# Patient Record
Sex: Female | Born: 1940 | Race: Black or African American | Smoking: Never smoker
Health system: Southern US, Community
[De-identification: ages and names within clinical notes are randomized; demographics above are authoritative.]

## PROBLEM LIST (undated history)

## (undated) DIAGNOSIS — I10 Essential (primary) hypertension: Secondary | ICD-10-CM

## (undated) DIAGNOSIS — Z87442 Personal history of urinary calculi: Secondary | ICD-10-CM

## (undated) DIAGNOSIS — E119 Type 2 diabetes mellitus without complications: Secondary | ICD-10-CM

---

## 2010-05-07 ENCOUNTER — Other Ambulatory Visit (HOSPITAL_COMMUNITY): Payer: Self-pay | Admitting: Family Medicine

## 2010-05-07 DIAGNOSIS — E038 Other specified hypothyroidism: Secondary | ICD-10-CM

## 2010-05-11 ENCOUNTER — Ambulatory Visit (HOSPITAL_COMMUNITY)
Admission: RE | Admit: 2010-05-11 | Discharge: 2010-05-11 | Disposition: A | Discharge status: Home / Self Care | Payer: Medicare Other | Source: Ambulatory Visit | Attending: Family Medicine | Admitting: Family Medicine

## 2010-05-11 ENCOUNTER — Encounter (HOSPITAL_COMMUNITY): Payer: Self-pay

## 2010-05-11 DIAGNOSIS — R000 Tachycardia, unspecified: Secondary | ICD-10-CM | POA: Insufficient documentation

## 2010-05-11 DIAGNOSIS — E038 Other specified hypothyroidism: Secondary | ICD-10-CM

## 2010-05-11 DIAGNOSIS — E041 Nontoxic single thyroid nodule: Secondary | ICD-10-CM | POA: Insufficient documentation

## 2010-05-11 HISTORY — DX: Essential (primary) hypertension: I10

## 2010-05-11 MED ORDER — SODIUM IODIDE I 131 CAPSULE
10.0000 | Freq: Once | INTRAVENOUS | Status: AC | PRN
  Administered 2010-05-11: 9 via ORAL

## 2010-05-12 MED ORDER — SODIUM PERTECHNETATE TC 99M INJECTION
10.0000 | Freq: Once | INTRAVENOUS | Status: AC | PRN
  Administered 2010-05-12: 9.8 via INTRAVENOUS

## 2010-05-21 ENCOUNTER — Other Ambulatory Visit (HOSPITAL_COMMUNITY): Payer: Self-pay | Admitting: Family Medicine

## 2010-05-21 DIAGNOSIS — E049 Nontoxic goiter, unspecified: Secondary | ICD-10-CM

## 2010-05-25 ENCOUNTER — Ambulatory Visit (HOSPITAL_COMMUNITY)
Admission: RE | Admit: 2010-05-25 | Discharge: 2010-05-25 | Disposition: A | Discharge status: Home / Self Care | Payer: Medicare Other | Source: Ambulatory Visit | Attending: Family Medicine | Admitting: Family Medicine

## 2010-05-25 DIAGNOSIS — E042 Nontoxic multinodular goiter: Secondary | ICD-10-CM | POA: Insufficient documentation

## 2010-05-25 DIAGNOSIS — E049 Nontoxic goiter, unspecified: Secondary | ICD-10-CM

## 2010-06-17 ENCOUNTER — Other Ambulatory Visit (HOSPITAL_COMMUNITY): Payer: Self-pay | Admitting: "Endocrinology

## 2010-06-17 DIAGNOSIS — E0590 Thyrotoxicosis, unspecified without thyrotoxic crisis or storm: Secondary | ICD-10-CM

## 2010-06-22 ENCOUNTER — Ambulatory Visit (HOSPITAL_COMMUNITY): Admission: RE | Admit: 2010-06-22 | Payer: Medicare Other | Source: Ambulatory Visit

## 2010-06-23 ENCOUNTER — Other Ambulatory Visit (HOSPITAL_COMMUNITY): Payer: Self-pay | Admitting: "Endocrinology

## 2010-06-23 DIAGNOSIS — E0590 Thyrotoxicosis, unspecified without thyrotoxic crisis or storm: Secondary | ICD-10-CM

## 2010-06-25 ENCOUNTER — Encounter (HOSPITAL_COMMUNITY): Payer: Self-pay

## 2010-06-25 ENCOUNTER — Encounter (HOSPITAL_COMMUNITY)
Admission: RE | Admit: 2010-06-25 | Discharge: 2010-06-25 | Disposition: A | Discharge status: Home / Self Care | Payer: Medicare Other | Source: Ambulatory Visit | Attending: "Endocrinology | Admitting: "Endocrinology

## 2010-06-25 DIAGNOSIS — E0590 Thyrotoxicosis, unspecified without thyrotoxic crisis or storm: Secondary | ICD-10-CM

## 2010-06-25 DIAGNOSIS — E0500 Thyrotoxicosis with diffuse goiter without thyrotoxic crisis or storm: Secondary | ICD-10-CM | POA: Insufficient documentation

## 2010-06-25 MED ORDER — SODIUM IODIDE I 131 CAPSULE
25.0000 | Freq: Once | INTRAVENOUS | Status: AC | PRN
  Administered 2010-06-25: 25 via ORAL

## 2012-06-09 IMAGING — US US SOFT TISSUE HEAD/NECK
1 series · 13 of 25 positions shown · non-contrast
Comparison: None
Correlation: Radionuclide thyroid uptake and scan 05/12/2010

CLINICAL DATA: Right side thyroid enlargement

THYROID ULTRASOUND
TECHNIQUE: Ultrasound examination of the thyroid gland and adjacent
soft tissues was performed.

[Series 1: us soft tissue head/neck · 0.08mm/px · 13 of 33 slices shown]
[im 1/33]
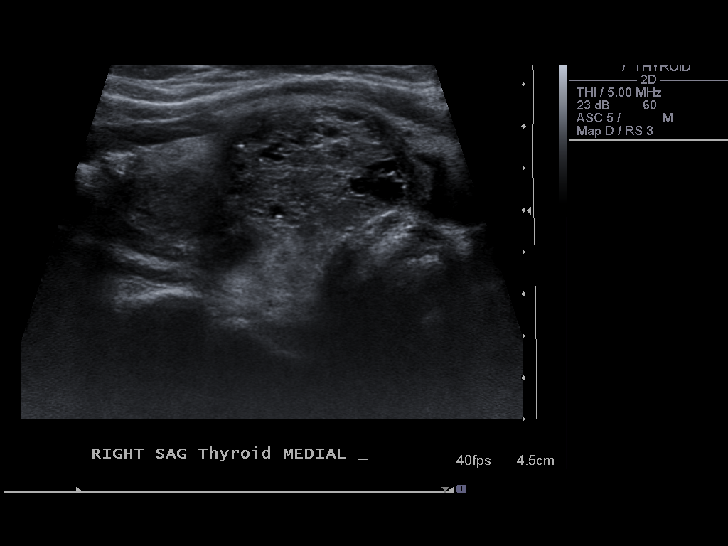
[im 3/33]
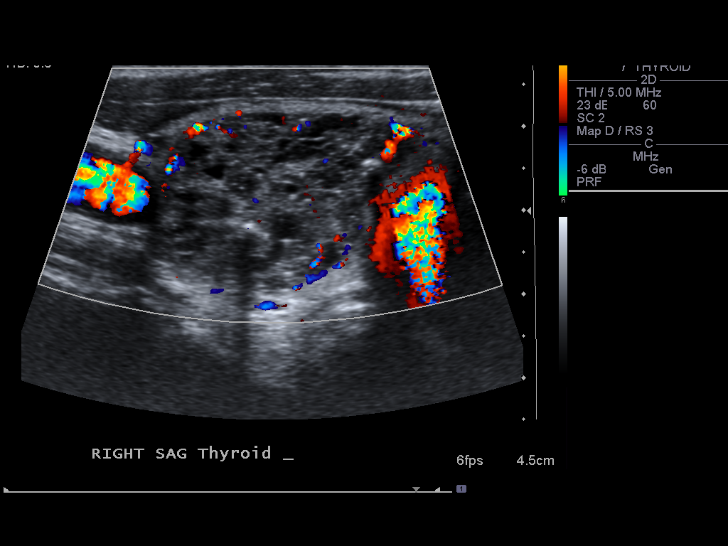
[im 6/33]
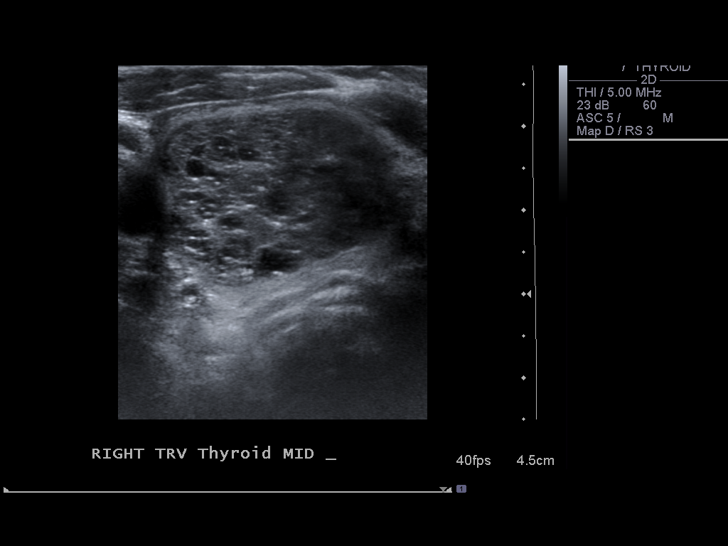
[im 9/33]
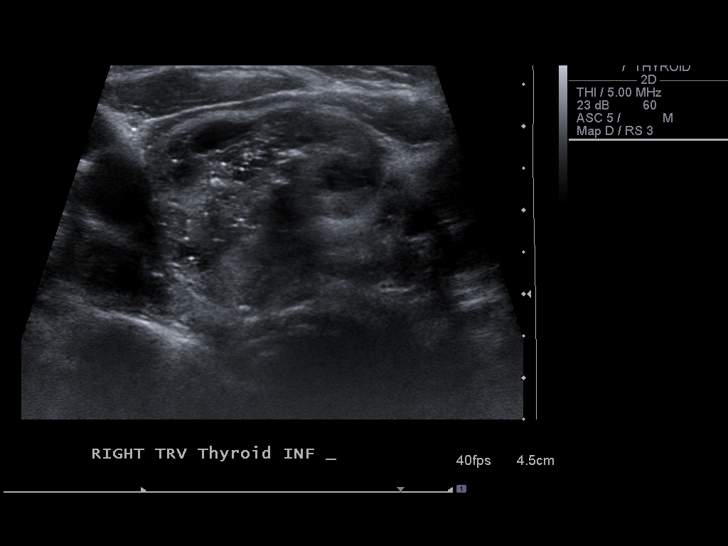
[im 11/33]
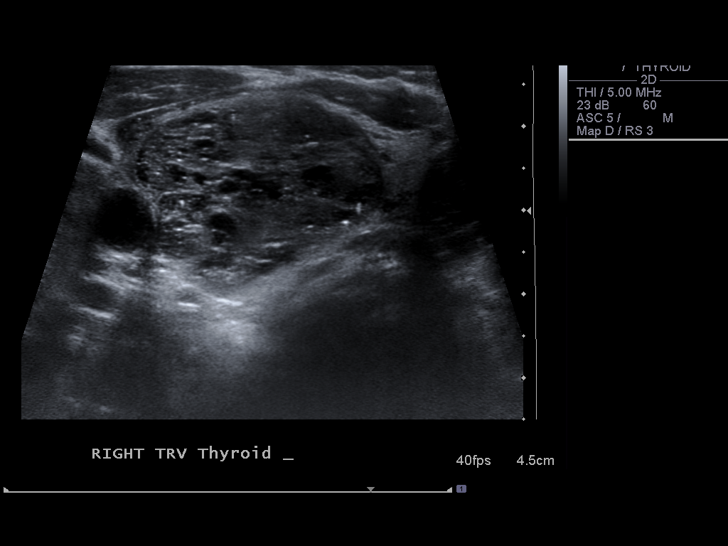
[im 14/33]
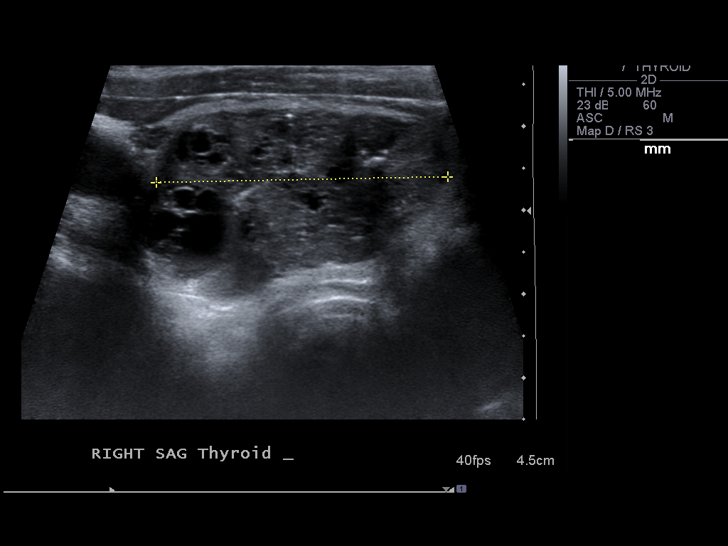
[im 17/33]
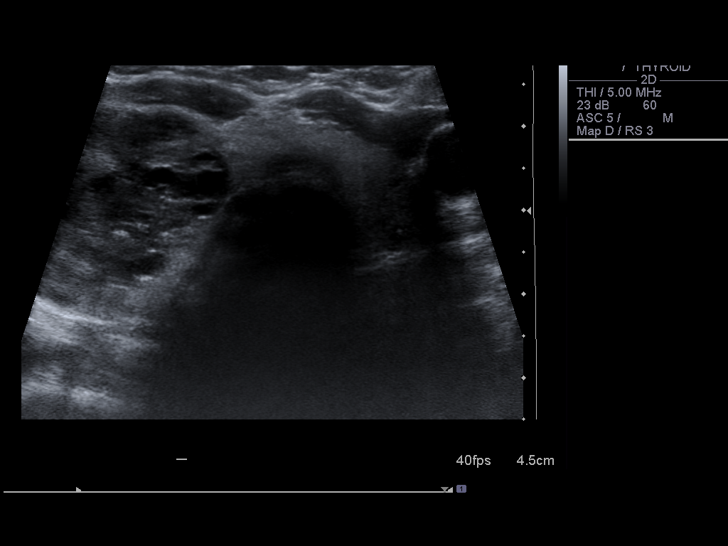
[im 19/33]
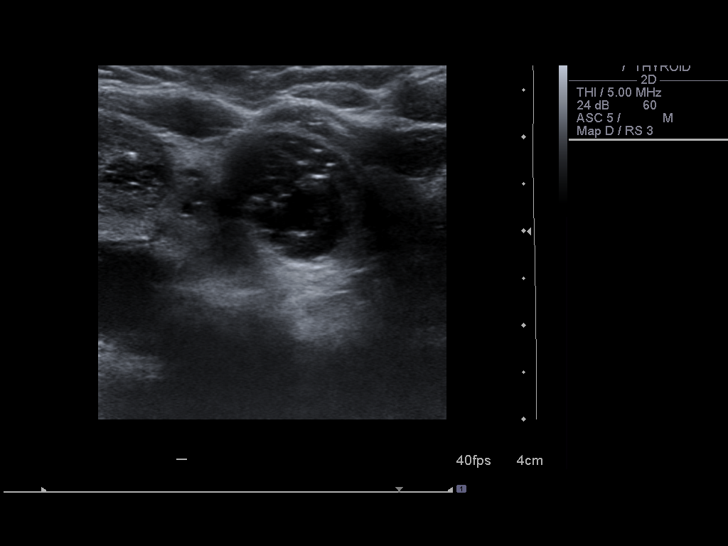
[im 22/33]
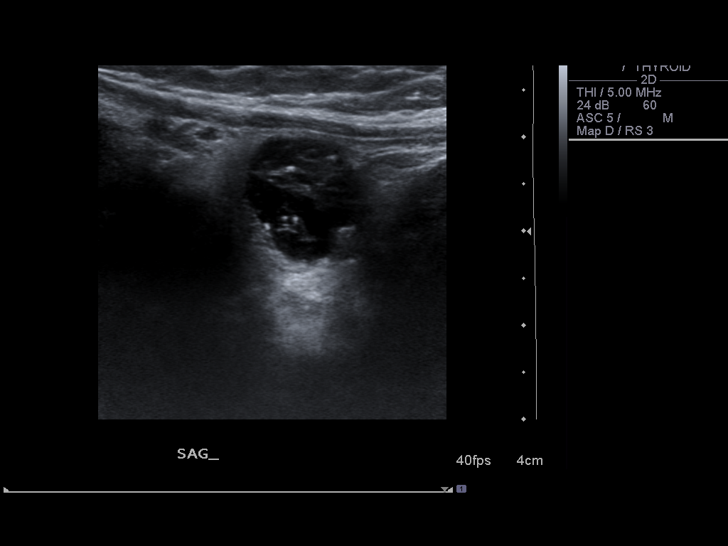
[im 25/33]
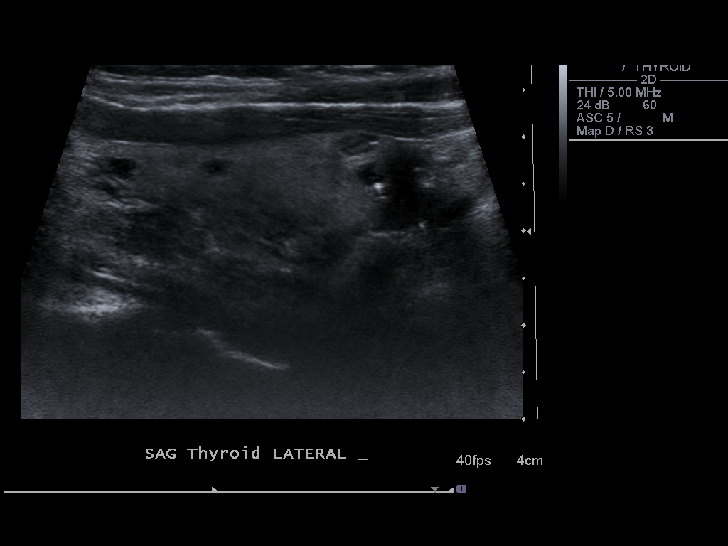
[im 27/33]
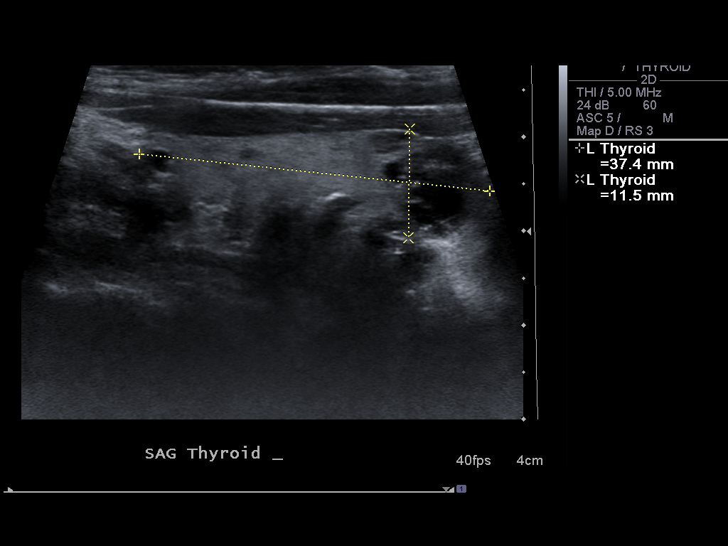
[im 30/33]
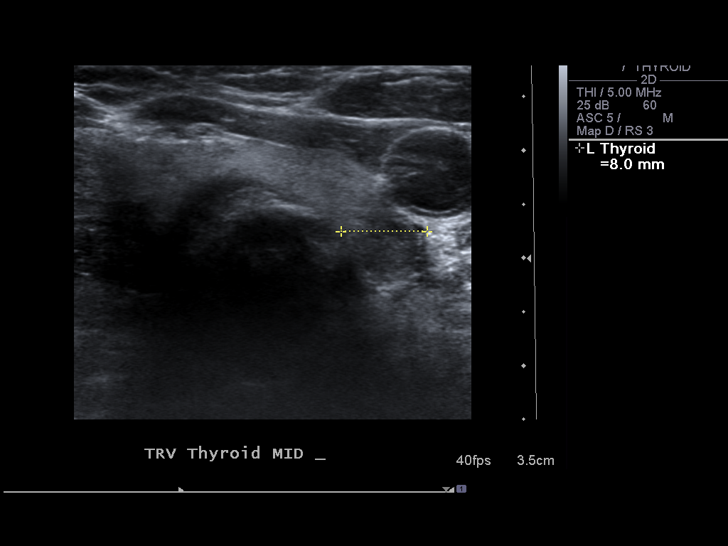
[im 33/33]
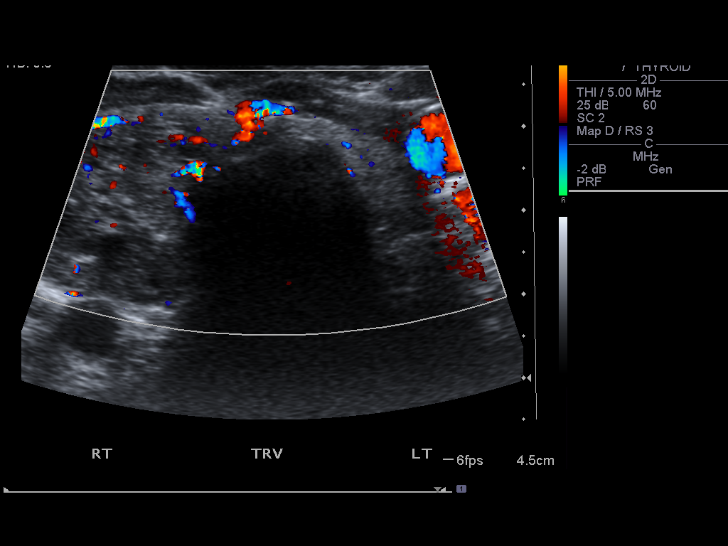

[13 of 25 positions shown; findings below may reference images not displayed]

FINDINGS: Right thyroid lobe:  4.7 x 2.4 x 3.2 cm
Left thyroid lobe:  3.7 x 1.2 x 0.8 cm
Isthmus:  6 mm thick

Focal nodules:  Dominant mass at mid right thyroid lobe, complex
solid heterogeneous in appearance but containing a few small cystic
areas as well, overall measuring 3.0 x 2.3 x 3.5 cm.  Additional
nodule identified at inferior isthmus 1.6 x 1.3 x 1.3 cm, also
demonstrating complex internal architecture including solid and
cystic components.  No calcifications are identified within thyroid
gland.

Lymphadenopathy:  Not identified
IMPRESSION: Two thyroid masses are identified, 3.0 x 2.3 x 3.5 cm at mid right
thyroid lobe and 1.6 x 1.3 x 1.3 cm at inferior left isthmus.
When correlated with the recent radionuclide scan of 05/12/2010,
the two nodules identified on current sonographic study correspond
to hot nodules seen on the previous radionuclide scan, compatible
with hyperfunctioning thyroid nodules likely adenomas with
suppression of tracer uptake within remaining normal thyroid
parenchyma.
Consider radioiodine therapy with I 131 as treatment for
hyperfunctioning thyroid adenomas.

## 2015-07-24 DIAGNOSIS — E119 Type 2 diabetes mellitus without complications: Secondary | ICD-10-CM | POA: Diagnosis not present

## 2015-07-24 DIAGNOSIS — R011 Cardiac murmur, unspecified: Secondary | ICD-10-CM | POA: Diagnosis not present

## 2015-07-24 DIAGNOSIS — I10 Essential (primary) hypertension: Secondary | ICD-10-CM | POA: Diagnosis not present

## 2015-07-31 DIAGNOSIS — Z6825 Body mass index (BMI) 25.0-25.9, adult: Secondary | ICD-10-CM | POA: Diagnosis not present

## 2015-07-31 DIAGNOSIS — Z0000 Encounter for general adult medical examination without abnormal findings: Secondary | ICD-10-CM | POA: Diagnosis not present

## 2015-07-31 DIAGNOSIS — E782 Mixed hyperlipidemia: Secondary | ICD-10-CM | POA: Diagnosis not present

## 2015-07-31 DIAGNOSIS — R011 Cardiac murmur, unspecified: Secondary | ICD-10-CM | POA: Diagnosis not present

## 2015-07-31 DIAGNOSIS — I10 Essential (primary) hypertension: Secondary | ICD-10-CM | POA: Diagnosis not present

## 2015-07-31 DIAGNOSIS — E119 Type 2 diabetes mellitus without complications: Secondary | ICD-10-CM | POA: Diagnosis not present

## 2016-01-22 DIAGNOSIS — Z23 Encounter for immunization: Secondary | ICD-10-CM | POA: Diagnosis not present

## 2016-01-22 DIAGNOSIS — I10 Essential (primary) hypertension: Secondary | ICD-10-CM | POA: Diagnosis not present

## 2016-01-22 DIAGNOSIS — R011 Cardiac murmur, unspecified: Secondary | ICD-10-CM | POA: Diagnosis not present

## 2016-01-22 DIAGNOSIS — E119 Type 2 diabetes mellitus without complications: Secondary | ICD-10-CM | POA: Diagnosis not present

## 2016-01-22 DIAGNOSIS — E782 Mixed hyperlipidemia: Secondary | ICD-10-CM | POA: Diagnosis not present

## 2016-01-22 DIAGNOSIS — L309 Dermatitis, unspecified: Secondary | ICD-10-CM | POA: Diagnosis not present

## 2016-01-22 DIAGNOSIS — Z6825 Body mass index (BMI) 25.0-25.9, adult: Secondary | ICD-10-CM | POA: Diagnosis not present

## 2016-07-08 DIAGNOSIS — E119 Type 2 diabetes mellitus without complications: Secondary | ICD-10-CM | POA: Diagnosis not present

## 2016-07-08 DIAGNOSIS — E782 Mixed hyperlipidemia: Secondary | ICD-10-CM | POA: Diagnosis not present

## 2016-07-08 DIAGNOSIS — I10 Essential (primary) hypertension: Secondary | ICD-10-CM | POA: Diagnosis not present

## 2016-07-12 DIAGNOSIS — R011 Cardiac murmur, unspecified: Secondary | ICD-10-CM | POA: Diagnosis not present

## 2016-07-12 DIAGNOSIS — R69 Illness, unspecified: Secondary | ICD-10-CM | POA: Diagnosis not present

## 2016-07-12 DIAGNOSIS — E119 Type 2 diabetes mellitus without complications: Secondary | ICD-10-CM | POA: Diagnosis not present

## 2016-07-12 DIAGNOSIS — I10 Essential (primary) hypertension: Secondary | ICD-10-CM | POA: Diagnosis not present

## 2016-07-12 DIAGNOSIS — E782 Mixed hyperlipidemia: Secondary | ICD-10-CM | POA: Diagnosis not present

## 2016-07-12 DIAGNOSIS — Z6825 Body mass index (BMI) 25.0-25.9, adult: Secondary | ICD-10-CM | POA: Diagnosis not present

## 2016-07-13 DIAGNOSIS — R69 Illness, unspecified: Secondary | ICD-10-CM | POA: Diagnosis not present

## 2016-08-29 DIAGNOSIS — H5200 Hypermetropia, unspecified eye: Secondary | ICD-10-CM | POA: Diagnosis not present

## 2016-08-29 DIAGNOSIS — Z0100 Encounter for examination of eyes and vision without abnormal findings: Secondary | ICD-10-CM | POA: Diagnosis not present

## 2016-08-29 DIAGNOSIS — H25813 Combined forms of age-related cataract, bilateral: Secondary | ICD-10-CM | POA: Diagnosis not present

## 2016-10-14 DIAGNOSIS — E119 Type 2 diabetes mellitus without complications: Secondary | ICD-10-CM | POA: Diagnosis not present

## 2016-10-14 DIAGNOSIS — I10 Essential (primary) hypertension: Secondary | ICD-10-CM | POA: Diagnosis not present

## 2016-10-14 DIAGNOSIS — E782 Mixed hyperlipidemia: Secondary | ICD-10-CM | POA: Diagnosis not present

## 2016-10-20 DIAGNOSIS — Z6826 Body mass index (BMI) 26.0-26.9, adult: Secondary | ICD-10-CM | POA: Diagnosis not present

## 2016-10-20 DIAGNOSIS — Z0000 Encounter for general adult medical examination without abnormal findings: Secondary | ICD-10-CM | POA: Diagnosis not present

## 2016-10-20 DIAGNOSIS — R011 Cardiac murmur, unspecified: Secondary | ICD-10-CM | POA: Diagnosis not present

## 2016-10-20 DIAGNOSIS — E782 Mixed hyperlipidemia: Secondary | ICD-10-CM | POA: Diagnosis not present

## 2016-10-20 DIAGNOSIS — Z6825 Body mass index (BMI) 25.0-25.9, adult: Secondary | ICD-10-CM | POA: Diagnosis not present

## 2016-10-20 DIAGNOSIS — I10 Essential (primary) hypertension: Secondary | ICD-10-CM | POA: Diagnosis not present

## 2016-10-20 DIAGNOSIS — E119 Type 2 diabetes mellitus without complications: Secondary | ICD-10-CM | POA: Diagnosis not present

## 2016-11-08 DIAGNOSIS — R69 Illness, unspecified: Secondary | ICD-10-CM | POA: Diagnosis not present

## 2017-04-12 DIAGNOSIS — E119 Type 2 diabetes mellitus without complications: Secondary | ICD-10-CM | POA: Diagnosis not present

## 2017-04-12 DIAGNOSIS — Z8249 Family history of ischemic heart disease and other diseases of the circulatory system: Secondary | ICD-10-CM | POA: Diagnosis not present

## 2017-04-12 DIAGNOSIS — Z833 Family history of diabetes mellitus: Secondary | ICD-10-CM | POA: Diagnosis not present

## 2017-04-12 DIAGNOSIS — Z7984 Long term (current) use of oral hypoglycemic drugs: Secondary | ICD-10-CM | POA: Diagnosis not present

## 2017-04-12 DIAGNOSIS — I10 Essential (primary) hypertension: Secondary | ICD-10-CM | POA: Diagnosis not present

## 2017-10-24 DIAGNOSIS — L409 Psoriasis, unspecified: Secondary | ICD-10-CM | POA: Diagnosis not present

## 2017-10-24 DIAGNOSIS — Z23 Encounter for immunization: Secondary | ICD-10-CM | POA: Diagnosis not present

## 2017-10-24 DIAGNOSIS — Z6826 Body mass index (BMI) 26.0-26.9, adult: Secondary | ICD-10-CM | POA: Diagnosis not present

## 2017-11-24 DIAGNOSIS — H5200 Hypermetropia, unspecified eye: Secondary | ICD-10-CM | POA: Diagnosis not present

## 2017-11-24 DIAGNOSIS — H25813 Combined forms of age-related cataract, bilateral: Secondary | ICD-10-CM | POA: Diagnosis not present

## 2017-11-24 DIAGNOSIS — E119 Type 2 diabetes mellitus without complications: Secondary | ICD-10-CM | POA: Diagnosis not present

## 2018-01-16 DIAGNOSIS — H02822 Cysts of right lower eyelid: Secondary | ICD-10-CM | POA: Diagnosis not present

## 2018-01-16 DIAGNOSIS — H25813 Combined forms of age-related cataract, bilateral: Secondary | ICD-10-CM | POA: Diagnosis not present

## 2018-01-16 DIAGNOSIS — H02824 Cysts of left upper eyelid: Secondary | ICD-10-CM | POA: Diagnosis not present

## 2018-02-15 NOTE — Patient Instructions (Signed)
Your procedure is scheduled on:02/26/2018               [facility]    [facility].  Call this number if you have problems the morning of surgery: [phone]   Remember:   Do not eat or drink :After Midnight.    Take these medicines the morning of surgery with A SIP OF WATER:     Losartan and Amlodipine       Do not wear jewelry, make-up or nail polish.  Do not wear lotions, powders, or perfumes. You may wear deodorant.  Do not bring valuables to the hospital.  Contacts, dentures or bridgework may not be worn into surgery.  Patients discharged the day of surgery will not be allowed to drive home.  Name and phone number of your driver.                                                                                                                                       Cataract Surgery  A cataract is a clouding of the lens of the eye. When a lens becomes cloudy, vision is reduced based on the degree and nature of the clouding. Surgery may be needed to improve vision. Surgery removes the cloudy lens and usually replaces it with a substitute lens (intraocular lens, IOL). LET YOUR EYE DOCTOR KNOW ABOUT:  Allergies to food or medicine.   Medicines taken including herbs, eyedrops, over-the-counter medicines, and creams.   Use of steroids (by mouth or creams).   Previous problems with anesthetics or numbing medicine.   History of bleeding problems or blood clots.   Previous surgery.   Other health problems, including diabetes and kidney problems.   Possibility of pregnancy, if this applies.  RISKS AND COMPLICATIONS  Infection.   Inflammation of the eyeball (endophthalmitis) that can spread to both eyes (sympathetic ophthalmia).   Poor wound healing.   If an IOL is inserted, it can later fall out of proper position. This is very uncommon.   Clouding of the part of your eye that holds an IOL in place. This is called an "after-cataract." These are uncommon, but easily  treated.  BEFORE THE PROCEDURE  Do not eat or drink anything except small amounts of water for 8 to 12 before your surgery, or as directed by your caregiver.    Unless you are told otherwise, continue any eyedrops you have been prescribed.   Talk to your primary caregiver about all other medicines that you take (both prescription and non-prescription). In some cases, you may need to stop or change medicines near the time of your surgery. This is most important if you are taking blood-thinning medicine. Do not stop medicines unless you are told to do so.   Arrange for someone to drive you to and from the procedure.   Do not put contact lenses in either eye on  the day of your surgery.  PROCEDURE There is more than one method for safely removing a cataract. Your doctor can explain the differences and help determine which is best for you. Phacoemulsification surgery is the most common form of cataract surgery.  An injection is given behind the eye or eyedrops are given to make this a painless procedure.   A small cut (incision) is made on the edge of the clear, dome-shaped surface that covers the front of the eye (cornea).   A tiny probe is painlessly inserted into the eye. This device gives off ultrasound waves that soften and break up the cloudy center of the lens. This makes it easier for the cloudy lens to be removed by suction.   An IOL may be implanted.   The normal lens of the eye is covered by a clear capsule. Part of that capsule is intentionally left in the eye to support the IOL.   Your surgeon may or may not use stitches to close the incision.  There are other forms of cataract surgery that require a larger incision and stiches to close the eye. This approach is taken in cases where the doctor feels that the cataract cannot be easily removed using phacoemulsification. AFTER THE PROCEDURE  When an IOL is implanted, it does not need care. It becomes a permanent part of your eye  and cannot be seen or felt.   Your doctor will schedule follow-up exams to check on your progress.   Review your other medicines with your doctor to see which can be resumed after surgery.   Use eyedrops or take medicine as prescribed by your doctor.  Document Released: 12/23/2010 Document Reviewed: 12/20/2010 [facility] Patient Information 2012 [place], [place].  .Cataract Surgery Care After Refer to this sheet in the next few weeks. These instructions provide you with information on caring for yourself after your procedure. Your caregiver may also give you more specific instructions. Your treatment has been planned according to current medical practices, but problems sometimes occur. Call your caregiver if you have any problems or questions after your procedure.  HOME CARE INSTRUCTIONS   Avoid strenuous activities as directed by your caregiver.   Ask your caregiver when you can resume driving.   Use eyedrops or other medicines to help healing and control pressure inside your eye as directed by your caregiver.   Only take over-the-counter or prescription medicines for pain, discomfort, or fever as directed by your caregiver.   Do not to touch or rub your eyes.   You may be instructed to use a protective shield during the first few days and nights after surgery. If not, wear sunglasses to protect your eyes. This is to protect the eye from pressure or from being accidentally bumped.   Keep the area around your eye clean and dry. Avoid swimming or allowing water to hit you directly in the face while showering. Keep soap and shampoo out of your eyes.   Do not bend or lift heavy objects. Bending increases pressure in the eye. You can walk, climb stairs, and do light household chores.   Do not put a contact lens into the eye that had surgery until your caregiver says it is okay to do so.   Ask your doctor when you can return to work. This will depend on the kind of work that you do. If you  work in a dusty environment, you may be advised to wear protective eyewear for a period of time.  Ask your caregiver when it will be safe to engage in sexual activity.   Continue with your regular eye exams as directed by your caregiver.  What to expect:  It is normal to feel itching and mild discomfort for a few days after cataract surgery. Some fluid discharge is also common, and your eye may be sensitive to light and touch.   After 1 to 2 days, even moderate discomfort should disappear. In most cases, healing will take about 6 weeks.   If you received an intraocular lens (IOL), you may notice that colors are very bright or have a blue tinge. Also, if you have been in bright sunlight, everything may appear reddish for a few hours. If you see these color tinges, it is because your lens is clear and no longer cloudy. Within a few months after receiving an IOL, these extra colors should go away. When you have healed, you will probably need new glasses.  SEEK MEDICAL CARE IF:   You have increased bruising around your eye.   You have discomfort not helped by medicine.  SEEK IMMEDIATE MEDICAL CARE IF:   You have a  fever.   You have a worsening or sudden vision loss.   You have redness, swelling, or increasing pain in the eye.   You have a thick discharge from the eye that had surgery.  MAKE SURE YOU:  Understand these instructions.   Will watch your condition.   Will get help right away if you are not doing well or get worse.  Document Released: 07/23/2004 Document Revised: 12/23/2010 Document Reviewed: 08/27/2010 [facility] Patient Information 2012 [place].    Monitored Anesthesia Care  Monitored anesthesia care is an anesthesia service for a medical procedure. Anesthesia is the loss of the ability to feel pain. It is produced by medications called anesthetics. It may affect a small area of your body (local anesthesia), a large area of your body (regional anesthesia), or  your entire body (general anesthesia). The need for monitored anesthesia care depends your procedure, your condition, and the potential need for regional or general anesthesia. It is often provided during procedures where:   General anesthesia may be needed if there are complications. This is because you need special care when you are under general anesthesia.    You will be under local or regional anesthesia. This is so that you are able to have higher levels of anesthesia if needed.    You will receive calming medications (sedatives). This is especially the case if sedatives are given to put you in a semi-conscious state of relaxation (deep sedation). This is because the amount of sedative needed to produce this state can be hard to predict. Too much of a sedative can produce general anesthesia. Monitored anesthesia care is performed by one or more caregivers who have special training in all types of anesthesia. You will need to meet with these caregivers before your procedure. During this meeting, they will ask you about your medical history. They will also give you instructions to follow. (For example, you will need to stop eating and drinking before your procedure. You may also need to stop or change medications you are taking.) During your procedure, your caregivers will stay with you. They will:   Watch your condition. This includes watching you blood pressure, breathing, and level of pain.    Diagnose and treat problems that occur.    Give medications if they are needed. These may include calming medications (sedatives) and anesthetics.  Make sure you are comfortable.   Having monitored anesthesia care does not necessarily mean that you will be under anesthesia. It does mean that your caregivers will be able to manage anesthesia if you need it or if it occurs. It also means that you will be able to have a different type of anesthesia than you are having if you need it. When your procedure  is complete, your caregivers will continue to watch your condition. They will make sure any medications wear off before you are allowed to go home.  Document Released: 09/29/2004 Document Revised: 04/30/2012 Document Reviewed: 02/15/2012 [facility] Patient Information 2014 [place], [place].

## 2018-02-19 DIAGNOSIS — H25811 Combined forms of age-related cataract, right eye: Secondary | ICD-10-CM | POA: Diagnosis not present

## 2018-02-20 ENCOUNTER — Other Ambulatory Visit: Payer: Self-pay

## 2018-02-20 ENCOUNTER — Encounter (HOSPITAL_COMMUNITY): Payer: Self-pay

## 2018-02-20 ENCOUNTER — Encounter (HOSPITAL_COMMUNITY)
Admission: RE | Admit: 2018-02-20 | Discharge: 2018-02-20 | Disposition: A | Discharge status: Home / Self Care | Payer: Medicare HMO | Source: Ambulatory Visit | Attending: Ophthalmology | Admitting: Ophthalmology

## 2018-02-20 DIAGNOSIS — Z01812 Encounter for preprocedural laboratory examination: Secondary | ICD-10-CM | POA: Insufficient documentation

## 2018-02-20 HISTORY — DX: Personal history of urinary calculi: Z87.442

## 2018-02-20 HISTORY — DX: Type 2 diabetes mellitus without complications: E11.9

## 2018-02-20 LAB — BASIC METABOLIC PANEL
ANION GAP: 7 (ref 5–15)
BUN: 15 mg/dL (ref 8–23)
CALCIUM: 9.6 mg/dL (ref 8.9–10.3)
CHLORIDE: 103 mmol/L (ref 98–111)
CO2: 29 mmol/L (ref 22–32)
CREATININE: 0.78 mg/dL (ref 0.44–1.00)
GFR calc non Af Amer: >60 mL/min (ref >60)
Glucose, Bld: 111 mg/dL — ABNORMAL HIGH (ref 70–99)
Potassium: 4.1 mmol/L (ref 3.5–5.1)
Sodium: 139 mmol/L (ref 135–145)

## 2018-02-20 LAB — CBC WITH DIFFERENTIAL/PLATELET
Abs Immature Granulocytes: 0.01 10*3/uL (ref 0.00–0.07)
Basophils Absolute: 0.1 10*3/uL (ref 0.0–0.1)
Basophils Relative: 1 %
EOS ABS: 0.2 10*3/uL (ref 0.0–0.5)
EOS PCT: 4 %
HEMATOCRIT: 35.6 % — AB (ref 36.0–46.0)
Hemoglobin: 11.5 g/dL — ABNORMAL LOW (ref 12.0–15.0)
Immature Granulocytes: 0 %
LYMPHS ABS: 2.6 10*3/uL (ref 0.7–4.0)
Lymphocytes Relative: 41 %
MCH: 25.8 pg — ABNORMAL LOW (ref 26.0–34.0)
MCHC: 32.3 g/dL (ref 30.0–36.0)
MCV: 80 fL (ref 80.0–100.0)
MONO ABS: 0.4 10*3/uL (ref 0.1–1.0)
MONOS PCT: 7 %
Neutro Abs: 3.1 10*3/uL (ref 1.7–7.7)
Neutrophils Relative %: 47 %
Platelets: 269 10*3/uL (ref 150–400)
RBC: 4.45 MIL/uL (ref 3.87–5.11)
RDW: 12.2 % (ref 11.5–15.5)
WBC: 6.5 10*3/uL (ref 4.0–10.5)
nRBC: 0 % (ref 0.0–0.2)

## 2018-02-20 LAB — GLUCOSE, CAPILLARY: GLUCOSE-CAPILLARY: 108 mg/dL — AB (ref 70–99)

## 2018-02-20 LAB — HEMOGLOBIN A1C
Hgb A1c MFr Bld: 7.7 % — ABNORMAL HIGH (ref 4.8–5.6)
MEAN PLASMA GLUCOSE: 174.29 mg/dL

## 2018-02-21 NOTE — Pre-Procedure Instructions (Signed)
HgbA1C routed to PCP. 

## 2018-02-26 ENCOUNTER — Ambulatory Visit (HOSPITAL_COMMUNITY)
Admission: RE | Admit: 2018-02-26 | Discharge: 2018-02-26 | Disposition: A | Discharge status: Home / Self Care | Payer: Medicare HMO | Source: Ambulatory Visit | Attending: Ophthalmology | Admitting: Ophthalmology

## 2018-02-26 ENCOUNTER — Encounter (HOSPITAL_COMMUNITY): Payer: Self-pay | Admitting: Anesthesiology

## 2018-02-26 ENCOUNTER — Encounter (HOSPITAL_COMMUNITY)
Admission: RE | Disposition: A | Discharge status: Home / Self Care | Payer: Self-pay | Source: Ambulatory Visit | Attending: Ophthalmology

## 2018-02-26 ENCOUNTER — Ambulatory Visit (HOSPITAL_COMMUNITY): Payer: Medicare HMO | Admitting: Anesthesiology

## 2018-02-26 DIAGNOSIS — H25811 Combined forms of age-related cataract, right eye: Secondary | ICD-10-CM | POA: Diagnosis not present

## 2018-02-26 DIAGNOSIS — I10 Essential (primary) hypertension: Secondary | ICD-10-CM | POA: Diagnosis not present

## 2018-02-26 DIAGNOSIS — Z79899 Other long term (current) drug therapy: Secondary | ICD-10-CM | POA: Diagnosis not present

## 2018-02-26 DIAGNOSIS — E1136 Type 2 diabetes mellitus with diabetic cataract: Secondary | ICD-10-CM | POA: Insufficient documentation

## 2018-02-26 DIAGNOSIS — E119 Type 2 diabetes mellitus without complications: Secondary | ICD-10-CM | POA: Diagnosis not present

## 2018-02-26 DIAGNOSIS — E7800 Pure hypercholesterolemia, unspecified: Secondary | ICD-10-CM | POA: Diagnosis not present

## 2018-02-26 DIAGNOSIS — H259 Unspecified age-related cataract: Secondary | ICD-10-CM | POA: Diagnosis not present

## 2018-02-26 HISTORY — PX: CATARACT EXTRACTION W/PHACO: SHX586

## 2018-02-26 LAB — GLUCOSE, CAPILLARY: Glucose-Capillary: 152 mg/dL — ABNORMAL HIGH (ref 70–99)

## 2018-02-26 SURGERY — PHACOEMULSIFICATION, CATARACT, WITH IOL INSERTION
Anesthesia: Monitor Anesthesia Care | Site: Eye | Laterality: Right

## 2018-02-26 MED ORDER — LACTATED RINGERS IV SOLN: INTRAVENOUS | Status: DC

## 2018-02-26 MED ORDER — PHENYLEPHRINE HCL 2.5 % OP SOLN
1.0000 [drp] | OPHTHALMIC | Status: AC
  Administered 2018-02-26 (×3): 1 [drp] via OPHTHALMIC

## 2018-02-26 MED ORDER — BSS IO SOLN
INTRAOCULAR | Status: DC | PRN
  Administered 2018-02-26: 15 mL via INTRAOCULAR

## 2018-02-26 MED ORDER — POVIDONE-IODINE 5 % OP SOLN
OPHTHALMIC | Status: DC | PRN
  Administered 2018-02-26: 1 via OPHTHALMIC

## 2018-02-26 MED ORDER — LIDOCAINE HCL (PF) 1 % IJ SOLN
INTRAOCULAR | Status: DC | PRN
  Administered 2018-02-26: .7 mL via OPHTHALMIC

## 2018-02-26 MED ORDER — PROVISC 10 MG/ML IO SOLN
INTRAOCULAR | Status: DC | PRN
  Administered 2018-02-26: 0.85 mL via INTRAOCULAR

## 2018-02-26 MED ORDER — CYCLOPENTOLATE-PHENYLEPHRINE 0.2-1 % OP SOLN
1.0000 [drp] | OPHTHALMIC | Status: AC
  Administered 2018-02-26 (×3): 1 [drp] via OPHTHALMIC

## 2018-02-26 MED ORDER — EPINEPHRINE PF 1 MG/ML IJ SOLN
INTRAMUSCULAR | Status: AC
  Filled 2018-02-26: qty 1

## 2018-02-26 MED ORDER — EPINEPHRINE PF 1 MG/ML IJ SOLN
INTRAOCULAR | Status: DC | PRN
  Administered 2018-02-26: 500 mL

## 2018-02-26 MED ORDER — NEOMYCIN-POLYMYXIN-DEXAMETH 3.5-10000-0.1 OP SUSP
OPHTHALMIC | Status: DC | PRN
  Administered 2018-02-26: 1 [drp] via OPHTHALMIC

## 2018-02-26 MED ORDER — LIDOCAINE HCL 3.5 % OP GEL
1.0000 "application " | Freq: Once | OPHTHALMIC | Status: AC
  Administered 2018-02-26: 1 via OPHTHALMIC

## 2018-02-26 MED ORDER — TETRACAINE HCL 0.5 % OP SOLN
1.0000 [drp] | OPHTHALMIC | Status: AC
  Administered 2018-02-26 (×3): 1 [drp] via OPHTHALMIC

## 2018-02-26 MED ORDER — SODIUM HYALURONATE 23 MG/ML IO SOLN
INTRAOCULAR | Status: DC | PRN
  Administered 2018-02-26: 0.6 mL via INTRAOCULAR

## 2018-02-26 SURGICAL SUPPLY — 15 items
CLOTH BEACON ORANGE TIMEOUT ST (SAFETY) ×2 IMPLANT
DEVICE MILOOP (MISCELLANEOUS) IMPLANT
EYE SHIELD UNIVERSAL CLEAR (GAUZE/BANDAGES/DRESSINGS) ×2 IMPLANT
GLOVE BIOGEL PI IND STRL 7.0 (GLOVE) ×2 IMPLANT
GLOVE BIOGEL PI INDICATOR 7.0 (GLOVE) ×2
LENS ALC ACRYL/TECN (Ophthalmic Related) ×2 IMPLANT
MILOOP DEVICE (MISCELLANEOUS)
NEEDLE HYPO 18GX1.5 BLUNT FILL (NEEDLE) ×2 IMPLANT
PAD ARMBOARD 7.5X6 YLW CONV (MISCELLANEOUS) ×2 IMPLANT
RING MALYGIN (MISCELLANEOUS) IMPLANT
SYR TB 1ML LL NO SAFETY (SYRINGE) ×2 IMPLANT
TAPE SURG TRANSPORE 1 IN (GAUZE/BANDAGES/DRESSINGS) ×1 IMPLANT
TAPE SURGICAL TRANSPORE 1 IN (GAUZE/BANDAGES/DRESSINGS) ×1
VISCOELASTIC ADDITIONAL (OPHTHALMIC RELATED) ×2 IMPLANT
WATER STERILE IRR 250ML POUR (IV SOLUTION) ×2 IMPLANT

## 2018-02-26 NOTE — Transfer of Care (Signed)
Immediate Anesthesia Transfer of Care Note  Patient: Kelly Clarke  Procedure(s) Performed: CATARACT EXTRACTION PHACO AND INTRAOCULAR LENS PLACEMENT RIGHT EYE (CDE: 9.94) (Right Eye)  Patient Location: Short Stay  Anesthesia Type:MAC  Level of Consciousness: awake, alert , oriented and patient cooperative  Airway & Oxygen Therapy: Patient Spontanous Breathing  Post-op Assessment: Report given to RN and Post -op Vital signs reviewed and stable  Post vital signs: Reviewed and stable  Last Vitals:  Vitals Value Taken Time  BP    Temp    Pulse    Resp    SpO2      Last Pain:  Vitals:   02/26/18 0645  PainSc: 0-No pain         Complications: No apparent anesthesia complications

## 2018-02-26 NOTE — Discharge Instructions (Signed)
Please discharge patient when stable, will follow up today with Dr.  at the [facility] office immediately following discharge.  Leave shield in place until visit.  All paperwork with discharge instructions will be given at the office. ° °

## 2018-02-26 NOTE — Anesthesia Preprocedure Evaluation (Signed)
Anesthesia Evaluation    Airway Mallampati: II       Dental  (+) Edentulous Upper, Edentulous Lower   Pulmonary    breath sounds clear to auscultation       Cardiovascular hypertension, On Medications (-) DVT  Rhythm:regular     Neuro/Psych    GI/Hepatic   Endo/Other  diabetes, Type 2  Renal/GU      Musculoskeletal   Abdominal   Peds  Hematology   Anesthesia Other Findings   Reproductive/Obstetrics                             Anesthesia Physical Anesthesia Plan  ASA: III  Anesthesia Plan: MAC   Post-op Pain Management:    Induction:   PONV Risk Score and Plan:   Airway Management Planned:   Additional Equipment:   Intra-op Plan:   Post-operative Plan:   Informed Consent:   Plan Discussed with: Anesthesiologist  Anesthesia Plan Comments:         Anesthesia Quick Evaluation

## 2018-02-26 NOTE — Op Note (Signed)
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 , MD, MA  Anesthesia: MAC, Topical Akten  Complications: None  Estimated Blood Loss: <75m (minimal)  Specimens: None  Implants: As above  Indications:  Visually significant age-related cataract, Right Eye  Procedure:  The patient was seen and identified in the pre-operative area. The operative eye was identified and dilated.  The operative eye was marked.  Topical anesthesia was administered to the operative eye.     The patient was then to the operative suite and placed in the supine position.  A timeout was performed confirming the patient, procedure to be performed, and all other relevant information.   The patient's face was prepped and draped in the usual fashion for intra-ocular surgery.  A lid speculum was placed into the operative eye and the surgical microscope moved into place and focused.  A superotemporal paracentesis was created using a 20 gauge paracentesis blade.  Shugarcaine was injected into the anterior chamber.  Viscoelastic was injected into the anterior chamber.  A temporal clear-corneal main wound incision was created using a 2.454mmicrokeratome.  A continuous curvilinear capsulorrhexis was initiated using an irrigating cystitome and completed using capsulorrhexis forceps.  Hydrodissection and hydrodeliniation were performed.  Viscoelastic was injected into the anterior chamber.  A phacoemulsification handpiece and a chopper as a second instrument were used to remove the nucleus and epinucleus. The irrigation/aspiration handpiece was used to remove any remaining cortical  material.   The capsular bag was reinflated with viscoelastic, checked, and found to be intact.  The intraocular lens was inserted into the capsular bag and dialed into place using a Kuglen hook.  The irrigation/aspiration handpiece was used to remove any remaining viscoelastic.  The clear corneal wound and paracentesis wounds were then hydrated and checked with Weck-Cels to be watertight.  The lid-speculum and drape was removed, and the patient's face was cleaned with a wet and dry 4x4.  Maxitrol was instilled in the eye before a clear shield was taped over the eye. The patient was taken to the post-operative care unit in good condition, having tolerated the procedure well.  Post-Op Instructions: [facility] and will receive all other orders and instructions.

## 2018-02-26 NOTE — Anesthesia Postprocedure Evaluation (Signed)
Anesthesia Post Note  Patient: Kelly Clarke  Procedure(s) Performed: CATARACT EXTRACTION PHACO AND INTRAOCULAR LENS PLACEMENT RIGHT EYE (CDE: 9.94) (Right Eye)  Patient location during evaluation: Short Stay Anesthesia Type: MAC Level of consciousness: awake and alert and oriented Pain management: pain level controlled Vital Signs Assessment: post-procedure vital signs reviewed and stable Respiratory status: spontaneous breathing Cardiovascular status: stable Postop Assessment: no apparent nausea or vomiting Anesthetic complications: no     Last Vitals:  Vitals:   02/26/18 0645  BP: (!) 157/84  Pulse: 84  Resp: 18  Temp: 36.9 C  SpO2: 97%    Last Pain:  Vitals:   02/26/18 0645  PainSc: 0-No pain                 ADAMS, AMY A

## 2018-02-26 NOTE — H&P (Signed)
The H and P was reviewed and updated. The patient was examined.  No changes were found after exam.  The surgical eye was marked.  

## 2018-02-26 NOTE — Anesthesia Procedure Notes (Signed)
Procedure Name: MAC Date/Time: 02/26/2018 7:53 AM Performed by: Andree Elk Amy A, CRNA Pre-anesthesia Checklist: Patient identified, Emergency Drugs available, Suction available, Timeout performed and Patient being monitored Patient Re-evaluated:Patient Re-evaluated prior to induction Oxygen Delivery Method: Nasal Cannula

## 2018-02-27 ENCOUNTER — Encounter (HOSPITAL_COMMUNITY): Payer: Self-pay | Admitting: Ophthalmology

## 2018-03-05 DIAGNOSIS — H25812 Combined forms of age-related cataract, left eye: Secondary | ICD-10-CM | POA: Diagnosis not present

## 2018-03-07 ENCOUNTER — Encounter (HOSPITAL_COMMUNITY)
Admission: RE | Admit: 2018-03-07 | Discharge: 2018-03-07 | Disposition: A | Discharge status: Home / Self Care | Payer: Medicare HMO | Source: Ambulatory Visit | Attending: Ophthalmology | Admitting: Ophthalmology

## 2018-03-12 ENCOUNTER — Ambulatory Visit (HOSPITAL_COMMUNITY)
Admission: RE | Admit: 2018-03-12 | Discharge: 2018-03-12 | Disposition: A | Discharge status: Home / Self Care | Payer: Medicare HMO | Source: Ambulatory Visit | Attending: Ophthalmology | Admitting: Ophthalmology

## 2018-03-12 ENCOUNTER — Encounter (HOSPITAL_COMMUNITY): Payer: Self-pay | Admitting: *Deleted

## 2018-03-12 ENCOUNTER — Encounter (HOSPITAL_COMMUNITY)
Admission: RE | Disposition: A | Discharge status: Home / Self Care | Payer: Self-pay | Source: Ambulatory Visit | Attending: Ophthalmology

## 2018-03-12 ENCOUNTER — Ambulatory Visit (HOSPITAL_COMMUNITY): Payer: Medicare HMO | Admitting: Anesthesiology

## 2018-03-12 DIAGNOSIS — E1136 Type 2 diabetes mellitus with diabetic cataract: Secondary | ICD-10-CM | POA: Diagnosis not present

## 2018-03-12 DIAGNOSIS — Z7984 Long term (current) use of oral hypoglycemic drugs: Secondary | ICD-10-CM | POA: Insufficient documentation

## 2018-03-12 DIAGNOSIS — Z79899 Other long term (current) drug therapy: Secondary | ICD-10-CM | POA: Insufficient documentation

## 2018-03-12 DIAGNOSIS — I10 Essential (primary) hypertension: Secondary | ICD-10-CM | POA: Diagnosis not present

## 2018-03-12 DIAGNOSIS — H259 Unspecified age-related cataract: Secondary | ICD-10-CM | POA: Diagnosis present

## 2018-03-12 DIAGNOSIS — H25812 Combined forms of age-related cataract, left eye: Secondary | ICD-10-CM | POA: Diagnosis not present

## 2018-03-12 DIAGNOSIS — E119 Type 2 diabetes mellitus without complications: Secondary | ICD-10-CM | POA: Diagnosis not present

## 2018-03-12 HISTORY — PX: CATARACT EXTRACTION W/PHACO: SHX586

## 2018-03-12 LAB — GLUCOSE, CAPILLARY: Glucose-Capillary: 113 mg/dL — ABNORMAL HIGH (ref 70–99)

## 2018-03-12 SURGERY — PHACOEMULSIFICATION, CATARACT, WITH IOL INSERTION
Anesthesia: Monitor Anesthesia Care | Site: Eye | Laterality: Left

## 2018-03-12 MED ORDER — CYCLOPENTOLATE-PHENYLEPHRINE 0.2-1 % OP SOLN
1.0000 [drp] | OPHTHALMIC | Status: AC | PRN
  Administered 2018-03-12 (×3): 1 [drp] via OPHTHALMIC

## 2018-03-12 MED ORDER — BSS IO SOLN
INTRAOCULAR | Status: DC | PRN
  Administered 2018-03-12: 15 mL

## 2018-03-12 MED ORDER — NEOMYCIN-POLYMYXIN-DEXAMETH 3.5-10000-0.1 OP SUSP
OPHTHALMIC | Status: DC | PRN
  Administered 2018-03-12: 2 [drp] via OPHTHALMIC

## 2018-03-12 MED ORDER — TETRACAINE HCL 0.5 % OP SOLN
1.0000 [drp] | OPHTHALMIC | Status: AC | PRN
  Administered 2018-03-12 (×3): 1 [drp] via OPHTHALMIC

## 2018-03-12 MED ORDER — SODIUM HYALURONATE 23 MG/ML IO SOLN
INTRAOCULAR | Status: DC | PRN
  Administered 2018-03-12: 0.6 mL via INTRAOCULAR

## 2018-03-12 MED ORDER — PHENYLEPHRINE HCL 2.5 % OP SOLN
1.0000 [drp] | OPHTHALMIC | Status: AC | PRN
  Administered 2018-03-12 (×3): 1 [drp] via OPHTHALMIC

## 2018-03-12 MED ORDER — LIDOCAINE HCL 3.5 % OP GEL
1.0000 "application " | Freq: Once | OPHTHALMIC | Status: AC
  Administered 2018-03-12: 1 via OPHTHALMIC

## 2018-03-12 MED ORDER — PROVISC 10 MG/ML IO SOLN
INTRAOCULAR | Status: DC | PRN
  Administered 2018-03-12: 0.85 mL via INTRAOCULAR

## 2018-03-12 MED ORDER — EPINEPHRINE PF 1 MG/ML IJ SOLN
INTRAOCULAR | Status: DC | PRN
  Administered 2018-03-12: 500 mL

## 2018-03-12 MED ORDER — POVIDONE-IODINE 5 % OP SOLN
OPHTHALMIC | Status: DC | PRN
  Administered 2018-03-12: 1 via OPHTHALMIC

## 2018-03-12 MED ORDER — EPINEPHRINE PF 1 MG/ML IJ SOLN
INTRAMUSCULAR | Status: AC
  Filled 2018-03-12: qty 2

## 2018-03-12 MED ORDER — LIDOCAINE HCL (PF) 1 % IJ SOLN
INTRAOCULAR | Status: DC | PRN
  Administered 2018-03-12: 1 mL via OPHTHALMIC

## 2018-03-12 SURGICAL SUPPLY — 15 items
CLOTH BEACON ORANGE TIMEOUT ST (SAFETY) ×1 IMPLANT
EYE SHIELD UNIVERSAL CLEAR (GAUZE/BANDAGES/DRESSINGS) ×1 IMPLANT
GLOVE BIOGEL PI IND STRL 6.5 (GLOVE) IMPLANT
GLOVE BIOGEL PI IND STRL 7.0 (GLOVE) IMPLANT
GLOVE BIOGEL PI INDICATOR 6.5 (GLOVE) ×1
GLOVE BIOGEL PI INDICATOR 7.0 (GLOVE) ×1
LENS ALC ACRYL/TECN (Ophthalmic Related) ×1 IMPLANT
NDL HYPO 18GX1.5 BLUNT FILL (NEEDLE) IMPLANT
NEEDLE HYPO 18GX1.5 BLUNT FILL (NEEDLE) ×2 IMPLANT
PAD ARMBOARD 7.5X6 YLW CONV (MISCELLANEOUS) ×1 IMPLANT
SYR TB 1ML LL NO SAFETY (SYRINGE) ×1 IMPLANT
TAPE SURG TRANSPORE 1 IN (GAUZE/BANDAGES/DRESSINGS) IMPLANT
TAPE SURGICAL TRANSPORE 1 IN (GAUZE/BANDAGES/DRESSINGS) ×1
VISCOELASTIC ADDITIONAL (OPHTHALMIC RELATED) ×1 IMPLANT
WATER STERILE IRR 250ML POUR (IV SOLUTION) ×1 IMPLANT

## 2018-03-12 NOTE — Anesthesia Postprocedure Evaluation (Signed)
Anesthesia Post Note  Patient: Kelly Clarke  Procedure(s) Performed: CATARACT EXTRACTION PHACO AND INTRAOCULAR LENS PLACEMENT ([place]) (Left Eye)  Anesthesia Type: MAC Level of consciousness: awake and alert and oriented Pain management: pain level controlled Vital Signs Assessment: post-procedure vital signs reviewed and stable Respiratory status: spontaneous breathing Cardiovascular status: blood pressure returned to baseline and stable Postop Assessment: adequate PO intake Anesthetic complications: no     Last Vitals:  Vitals:   03/12/18 1035  BP: (!) 154/83  Resp: 20  Temp: 36.7 C  SpO2: 94%    Last Pain:  Vitals:   03/12/18 1035  TempSrc: Oral  PainSc: 0-No pain                 ,

## 2018-03-12 NOTE — Transfer of Care (Signed)
Immediate Anesthesia Transfer of Care Note  Patient: Kelly Clarke  Procedure(s) Performed: CATARACT EXTRACTION PHACO AND INTRAOCULAR LENS PLACEMENT ([facility]) (Left Eye)  Patient Location: Short Stay  Anesthesia Type:MAC  Level of Consciousness: awake  Airway & Oxygen Therapy: Patient Spontanous Breathing  Post-op Assessment: Report given to RN  Post vital signs: Reviewed and stable  Last Vitals:  Vitals Value Taken Time  BP    Temp    Pulse    Resp    SpO2      Last Pain:  Vitals:   03/12/18 1035  TempSrc: Oral  PainSc: 0-No pain      Patients Stated Pain Goal: 6 (74/12/87 [id number])  Complications: No apparent anesthesia complications

## 2018-03-12 NOTE — Op Note (Signed)
Date of procedure: 03/12/18  Pre-operative diagnosis: Visually significant age-related cataract, Left Eye (H25.812)  Post-operative diagnosis: Visually significant age-related cataract, Left Eye  Procedure: Removal of cataract via phacoemulsification and insertion of intra-ocular lens [employer] PCB00  +22.5D into the capsular bag of the Left Eye  Attending surgeon: Gerda Diss. , MD, MA  Anesthesia: MAC, Topical Akten  Complications: None  Estimated Blood Loss: <58m (minimal)  Specimens: None  Implants: As above  Indications:  Visually significant age-related cataract, Left Eye  Procedure:  The patient was seen and identified in the pre-operative area. The operative eye was identified and dilated.  The operative eye was marked.  Topical anesthesia was administered to the operative eye.     The patient was then to the operative suite and placed in the supine position.  A timeout was performed confirming the patient, procedure to be performed, and all other relevant information.   The patient's face was prepped and draped in the usual fashion for intra-ocular surgery.  A lid speculum was placed into the operative eye and the surgical microscope moved into place and focused.  An inferotemporal paracentesis was created using a 20 gauge paracentesis blade.  Shugarcaine was injected into the anterior chamber.  Viscoelastic was injected into the anterior chamber.  A temporal clear-corneal main wound incision was created using a 2.484mmicrokeratome.  A continuous curvilinear capsulorrhexis was initiated using an irrigating cystitome and completed using capsulorrhexis forceps.  Hydrodissection and hydrodeliniation were performed.  Viscoelastic was injected into the anterior chamber.  A phacoemulsification handpiece and a chopper as a second instrument were used to remove the nucleus and epinucleus. The irrigation/aspiration handpiece was used to remove any remaining cortical  material.   The capsular bag was reinflated with viscoelastic, checked, and found to be intact.  The intraocular lens was inserted into the capsular bag and dialed into place using a Kuglen hook.  The irrigation/aspiration handpiece was used to remove any remaining viscoelastic.  The clear corneal wound and paracentesis wounds were then hydrated and checked with Weck-Cels to be watertight.  The lid-speculum and drape was removed, and the patient's face was cleaned with a wet and dry 4x4.  Maxitrol was instilled in the eye before a clear shield was taped over the eye. The patient was taken to the post-operative care unit in good condition, having tolerated the procedure well.  Post-Op Instructions: [facility] and will receive all other orders and instructions.

## 2018-03-12 NOTE — Anesthesia Preprocedure Evaluation (Signed)
Anesthesia Evaluation  Patient identified by MRN, date of birth, ID band Patient awake    Reviewed: Allergy & Precautions, H&P , NPO status , Patient's Chart, lab work & pertinent test results  Airway Mallampati: II  TM Distance: >3 FB Neck ROM: full    Dental no notable dental hx. (+) Edentulous Upper, Edentulous Lower   Pulmonary neg pulmonary ROS,    Pulmonary exam normal breath sounds clear to auscultation       Cardiovascular Exercise Tolerance: Good hypertension, On Medications (-) DVT negative cardio ROS   Rhythm:regular Rate:Normal     Neuro/Psych negative neurological ROS  negative psych ROS   GI/Hepatic negative GI ROS, Neg liver ROS,   Endo/Other  negative endocrine ROSdiabetes, Type 2  Renal/GU negative Renal ROS  negative genitourinary   Musculoskeletal   Abdominal   Peds  Hematology negative hematology ROS (+)   Anesthesia Other Findings   Reproductive/Obstetrics negative OB ROS                             Anesthesia Physical  Anesthesia Plan  ASA: III  Anesthesia Plan: MAC   Post-op Pain Management:    Induction:   PONV Risk Score and Plan:   Airway Management Planned:   Additional Equipment:   Intra-op Plan:   Post-operative Plan:   Informed Consent: I have reviewed the patients History and Physical, chart, labs and discussed the procedure including the risks, benefits and alternatives for the proposed anesthesia with the patient or authorized representative who has indicated his/her understanding and acceptance.     Dental Advisory Given  Plan Discussed with: Anesthesiologist  Anesthesia Plan Comments:         Anesthesia Quick Evaluation

## 2018-03-12 NOTE — H&P (Signed)
The H and P was reviewed and updated. The patient was examined.  No changes were found after exam.  The surgical eye was marked.  

## 2018-03-12 NOTE — Discharge Instructions (Signed)
Please discharge patient when stable, will follow up today with Dr.  at the [facility] office immediately following discharge.  Leave shield in place until visit.  All paperwork with discharge instructions will be given at the office. ° °

## 2018-03-13 ENCOUNTER — Encounter (HOSPITAL_COMMUNITY): Payer: Self-pay | Admitting: Ophthalmology

## 2018-08-27 DIAGNOSIS — Z0100 Encounter for examination of eyes and vision without abnormal findings: Secondary | ICD-10-CM | POA: Diagnosis not present

## 2018-11-09 DIAGNOSIS — Z23 Encounter for immunization: Secondary | ICD-10-CM | POA: Diagnosis not present

## 2019-07-31 DIAGNOSIS — Z825 Family history of asthma and other chronic lower respiratory diseases: Secondary | ICD-10-CM | POA: Diagnosis not present

## 2019-07-31 DIAGNOSIS — Z809 Family history of malignant neoplasm, unspecified: Secondary | ICD-10-CM | POA: Diagnosis not present

## 2019-07-31 DIAGNOSIS — Z7984 Long term (current) use of oral hypoglycemic drugs: Secondary | ICD-10-CM | POA: Diagnosis not present

## 2019-07-31 DIAGNOSIS — Z833 Family history of diabetes mellitus: Secondary | ICD-10-CM | POA: Diagnosis not present

## 2019-07-31 DIAGNOSIS — Z8249 Family history of ischemic heart disease and other diseases of the circulatory system: Secondary | ICD-10-CM | POA: Diagnosis not present

## 2019-07-31 DIAGNOSIS — E119 Type 2 diabetes mellitus without complications: Secondary | ICD-10-CM | POA: Diagnosis not present

## 2019-07-31 DIAGNOSIS — I10 Essential (primary) hypertension: Secondary | ICD-10-CM | POA: Diagnosis not present

## 2019-10-29 DIAGNOSIS — R69 Illness, unspecified: Secondary | ICD-10-CM | POA: Diagnosis not present

## 2020-03-16 DIAGNOSIS — E7849 Other hyperlipidemia: Secondary | ICD-10-CM | POA: Diagnosis not present

## 2020-03-16 DIAGNOSIS — E119 Type 2 diabetes mellitus without complications: Secondary | ICD-10-CM | POA: Diagnosis not present

## 2020-03-16 DIAGNOSIS — I10 Essential (primary) hypertension: Secondary | ICD-10-CM | POA: Diagnosis not present

## 2020-03-16 DIAGNOSIS — Z6825 Body mass index (BMI) 25.0-25.9, adult: Secondary | ICD-10-CM | POA: Diagnosis not present

## 2020-03-16 DIAGNOSIS — Z0001 Encounter for general adult medical examination with abnormal findings: Secondary | ICD-10-CM | POA: Diagnosis not present

## 2020-03-23 DIAGNOSIS — E7801 Familial hypercholesterolemia: Secondary | ICD-10-CM | POA: Diagnosis not present

## 2020-03-23 DIAGNOSIS — E559 Vitamin D deficiency, unspecified: Secondary | ICD-10-CM | POA: Diagnosis not present

## 2020-03-23 DIAGNOSIS — Z0001 Encounter for general adult medical examination with abnormal findings: Secondary | ICD-10-CM | POA: Diagnosis not present

## 2020-03-23 DIAGNOSIS — I10 Essential (primary) hypertension: Secondary | ICD-10-CM | POA: Diagnosis not present

## 2020-03-23 DIAGNOSIS — E1169 Type 2 diabetes mellitus with other specified complication: Secondary | ICD-10-CM | POA: Diagnosis not present

## 2020-03-23 DIAGNOSIS — D509 Iron deficiency anemia, unspecified: Secondary | ICD-10-CM | POA: Diagnosis not present

## 2020-03-23 DIAGNOSIS — Z6825 Body mass index (BMI) 25.0-25.9, adult: Secondary | ICD-10-CM | POA: Diagnosis not present

## 2020-03-23 DIAGNOSIS — E782 Mixed hyperlipidemia: Secondary | ICD-10-CM | POA: Diagnosis not present

## 2020-03-23 DIAGNOSIS — M419 Scoliosis, unspecified: Secondary | ICD-10-CM | POA: Diagnosis not present

## 2020-03-23 DIAGNOSIS — E039 Hypothyroidism, unspecified: Secondary | ICD-10-CM | POA: Diagnosis not present

## 2020-04-13 DIAGNOSIS — Z23 Encounter for immunization: Secondary | ICD-10-CM | POA: Diagnosis not present

## 2020-04-20 DIAGNOSIS — Z6825 Body mass index (BMI) 25.0-25.9, adult: Secondary | ICD-10-CM | POA: Diagnosis not present

## 2020-04-20 DIAGNOSIS — R011 Cardiac murmur, unspecified: Secondary | ICD-10-CM | POA: Diagnosis not present

## 2020-04-20 DIAGNOSIS — I10 Essential (primary) hypertension: Secondary | ICD-10-CM | POA: Diagnosis not present

## 2020-04-20 DIAGNOSIS — M419 Scoliosis, unspecified: Secondary | ICD-10-CM | POA: Diagnosis not present

## 2020-04-20 DIAGNOSIS — E1169 Type 2 diabetes mellitus with other specified complication: Secondary | ICD-10-CM | POA: Diagnosis not present

## 2020-06-01 DIAGNOSIS — M419 Scoliosis, unspecified: Secondary | ICD-10-CM | POA: Diagnosis not present

## 2020-06-01 DIAGNOSIS — Z6826 Body mass index (BMI) 26.0-26.9, adult: Secondary | ICD-10-CM | POA: Diagnosis not present

## 2020-06-01 DIAGNOSIS — E1169 Type 2 diabetes mellitus with other specified complication: Secondary | ICD-10-CM | POA: Diagnosis not present

## 2020-06-01 DIAGNOSIS — E7801 Familial hypercholesterolemia: Secondary | ICD-10-CM | POA: Diagnosis not present

## 2020-06-01 DIAGNOSIS — R011 Cardiac murmur, unspecified: Secondary | ICD-10-CM | POA: Diagnosis not present

## 2020-06-01 DIAGNOSIS — I10 Essential (primary) hypertension: Secondary | ICD-10-CM | POA: Diagnosis not present

## 2020-06-29 DIAGNOSIS — E785 Hyperlipidemia, unspecified: Secondary | ICD-10-CM | POA: Diagnosis not present

## 2020-06-29 DIAGNOSIS — K219 Gastro-esophageal reflux disease without esophagitis: Secondary | ICD-10-CM | POA: Diagnosis not present

## 2020-06-29 DIAGNOSIS — R69 Illness, unspecified: Secondary | ICD-10-CM | POA: Diagnosis not present

## 2020-06-29 DIAGNOSIS — Z823 Family history of stroke: Secondary | ICD-10-CM | POA: Diagnosis not present

## 2020-06-29 DIAGNOSIS — E11620 Type 2 diabetes mellitus with diabetic dermatitis: Secondary | ICD-10-CM | POA: Diagnosis not present

## 2020-06-29 DIAGNOSIS — Z7984 Long term (current) use of oral hypoglycemic drugs: Secondary | ICD-10-CM | POA: Diagnosis not present

## 2020-06-29 DIAGNOSIS — M810 Age-related osteoporosis without current pathological fracture: Secondary | ICD-10-CM | POA: Diagnosis not present

## 2020-06-29 DIAGNOSIS — I10 Essential (primary) hypertension: Secondary | ICD-10-CM | POA: Diagnosis not present

## 2020-06-29 DIAGNOSIS — I951 Orthostatic hypotension: Secondary | ICD-10-CM | POA: Diagnosis not present

## 2020-06-29 DIAGNOSIS — Z7722 Contact with and (suspected) exposure to environmental tobacco smoke (acute) (chronic): Secondary | ICD-10-CM | POA: Diagnosis not present

## 2020-07-06 DIAGNOSIS — I10 Essential (primary) hypertension: Secondary | ICD-10-CM | POA: Diagnosis not present

## 2020-07-06 DIAGNOSIS — E7849 Other hyperlipidemia: Secondary | ICD-10-CM | POA: Diagnosis not present

## 2020-07-06 DIAGNOSIS — E1169 Type 2 diabetes mellitus with other specified complication: Secondary | ICD-10-CM | POA: Diagnosis not present

## 2020-07-06 DIAGNOSIS — E782 Mixed hyperlipidemia: Secondary | ICD-10-CM | POA: Diagnosis not present

## 2020-08-31 DIAGNOSIS — E119 Type 2 diabetes mellitus without complications: Secondary | ICD-10-CM | POA: Diagnosis not present

## 2020-09-14 DIAGNOSIS — E7801 Familial hypercholesterolemia: Secondary | ICD-10-CM | POA: Diagnosis not present

## 2020-09-14 DIAGNOSIS — E1169 Type 2 diabetes mellitus with other specified complication: Secondary | ICD-10-CM | POA: Diagnosis not present

## 2020-09-14 DIAGNOSIS — M419 Scoliosis, unspecified: Secondary | ICD-10-CM | POA: Diagnosis not present

## 2020-09-14 DIAGNOSIS — Z6826 Body mass index (BMI) 26.0-26.9, adult: Secondary | ICD-10-CM | POA: Diagnosis not present

## 2020-09-14 DIAGNOSIS — I10 Essential (primary) hypertension: Secondary | ICD-10-CM | POA: Diagnosis not present

## 2020-09-14 DIAGNOSIS — R011 Cardiac murmur, unspecified: Secondary | ICD-10-CM | POA: Diagnosis not present

## 2020-11-09 DIAGNOSIS — E1169 Type 2 diabetes mellitus with other specified complication: Secondary | ICD-10-CM | POA: Diagnosis not present

## 2020-11-09 DIAGNOSIS — E782 Mixed hyperlipidemia: Secondary | ICD-10-CM | POA: Diagnosis not present

## 2020-11-09 DIAGNOSIS — I10 Essential (primary) hypertension: Secondary | ICD-10-CM | POA: Diagnosis not present

## 2020-11-09 DIAGNOSIS — E7849 Other hyperlipidemia: Secondary | ICD-10-CM | POA: Diagnosis not present

## 2020-11-17 ENCOUNTER — Other Ambulatory Visit: Payer: Self-pay

## 2020-11-17 ENCOUNTER — Observation Stay (HOSPITAL_COMMUNITY)
Admission: EM | Admit: 2020-11-17 | Discharge: 2020-11-19 | Disposition: A | Discharge status: Home / Self Care | Payer: Medicare HMO | Attending: Internal Medicine | Admitting: Internal Medicine

## 2020-11-17 ENCOUNTER — Emergency Department (HOSPITAL_COMMUNITY): Payer: Medicare HMO

## 2020-11-17 ENCOUNTER — Encounter (HOSPITAL_COMMUNITY): Payer: Self-pay | Admitting: *Deleted

## 2020-11-17 DIAGNOSIS — J111 Influenza due to unidentified influenza virus with other respiratory manifestations: Secondary | ICD-10-CM

## 2020-11-17 DIAGNOSIS — R059 Cough, unspecified: Secondary | ICD-10-CM | POA: Diagnosis not present

## 2020-11-17 DIAGNOSIS — R531 Weakness: Secondary | ICD-10-CM | POA: Diagnosis not present

## 2020-11-17 DIAGNOSIS — R7989 Other specified abnormal findings of blood chemistry: Secondary | ICD-10-CM | POA: Diagnosis present

## 2020-11-17 DIAGNOSIS — R509 Fever, unspecified: Secondary | ICD-10-CM | POA: Diagnosis not present

## 2020-11-17 DIAGNOSIS — I10 Essential (primary) hypertension: Secondary | ICD-10-CM | POA: Insufficient documentation

## 2020-11-17 DIAGNOSIS — R778 Other specified abnormalities of plasma proteins: Secondary | ICD-10-CM | POA: Diagnosis not present

## 2020-11-17 DIAGNOSIS — Z20822 Contact with and (suspected) exposure to covid-19: Secondary | ICD-10-CM | POA: Insufficient documentation

## 2020-11-17 DIAGNOSIS — Z7984 Long term (current) use of oral hypoglycemic drugs: Secondary | ICD-10-CM | POA: Insufficient documentation

## 2020-11-17 DIAGNOSIS — E119 Type 2 diabetes mellitus without complications: Secondary | ICD-10-CM | POA: Insufficient documentation

## 2020-11-17 DIAGNOSIS — J101 Influenza due to other identified influenza virus with other respiratory manifestations: Secondary | ICD-10-CM | POA: Diagnosis not present

## 2020-11-17 DIAGNOSIS — J09X2 Influenza due to identified novel influenza A virus with other respiratory manifestations: Secondary | ICD-10-CM | POA: Diagnosis not present

## 2020-11-17 DIAGNOSIS — Z79899 Other long term (current) drug therapy: Secondary | ICD-10-CM | POA: Diagnosis not present

## 2020-11-17 DIAGNOSIS — R000 Tachycardia, unspecified: Secondary | ICD-10-CM | POA: Diagnosis not present

## 2020-11-17 LAB — URINALYSIS, ROUTINE W REFLEX MICROSCOPIC
Bacteria, UA: NONE SEEN
Bilirubin Urine: NEGATIVE
Glucose, UA: NEGATIVE mg/dL
Hgb urine dipstick: NEGATIVE
Ketones, ur: NEGATIVE mg/dL
Nitrite: NEGATIVE
Protein, ur: NEGATIVE mg/dL
Specific Gravity, Urine: 1.013 (ref 1.005–1.030)
pH: 5 (ref 5.0–8.0)

## 2020-11-17 LAB — COMPREHENSIVE METABOLIC PANEL
ALT: 19 U/L (ref 0–44)
AST: 32 U/L (ref 15–41)
Albumin: 3.9 g/dL (ref 3.5–5.0)
Alkaline Phosphatase: 61 U/L (ref 38–126)
Anion gap: 8 (ref 5–15)
BUN: 17 mg/dL (ref 8–23)
CO2: 26 mmol/L (ref 22–32)
Calcium: 9.4 mg/dL (ref 8.9–10.3)
Chloride: 98 mmol/L (ref 98–111)
Creatinine, Ser: 0.98 mg/dL (ref 0.44–1.00)
GFR, Estimated: 58 mL/min — ABNORMAL LOW (ref >60)
Glucose, Bld: 161 mg/dL — ABNORMAL HIGH (ref 70–99)
Potassium: 4.2 mmol/L (ref 3.5–5.1)
Sodium: 132 mmol/L — ABNORMAL LOW (ref 135–145)
Total Bilirubin: 0.5 mg/dL (ref 0.3–1.2)
Total Protein: 7.6 g/dL (ref 6.5–8.1)

## 2020-11-17 LAB — CBC WITH DIFFERENTIAL/PLATELET
Abs Immature Granulocytes: 0.03 10*3/uL (ref 0.00–0.07)
Basophils Absolute: 0.1 10*3/uL (ref 0.0–0.1)
Basophils Relative: 1 %
Eosinophils Absolute: 0 10*3/uL (ref 0.0–0.5)
Eosinophils Relative: 0 %
HCT: 36.9 % (ref 36.0–46.0)
Hemoglobin: 12.1 g/dL (ref 12.0–15.0)
Immature Granulocytes: 0 %
Lymphocytes Relative: 13 %
Lymphs Abs: 1 10*3/uL (ref 0.7–4.0)
MCH: 26.4 pg (ref 26.0–34.0)
MCHC: 32.8 g/dL (ref 30.0–36.0)
MCV: 80.4 fL (ref 80.0–100.0)
Monocytes Absolute: 0.8 10*3/uL (ref 0.1–1.0)
Monocytes Relative: 11 %
Neutro Abs: 5.4 10*3/uL (ref 1.7–7.7)
Neutrophils Relative %: 75 %
Platelets: 210 10*3/uL (ref 150–400)
RBC: 4.59 MIL/uL (ref 3.87–5.11)
RDW: 12.8 % (ref 11.5–15.5)
WBC: 7.3 10*3/uL (ref 4.0–10.5)
nRBC: 0 % (ref 0.0–0.2)

## 2020-11-17 LAB — TROPONIN I (HIGH SENSITIVITY)
Troponin I (High Sensitivity): 508 ng/L (ref <18)
Troponin I (High Sensitivity): 476 ng/L (ref <18)

## 2020-11-17 LAB — RESP PANEL BY RT-PCR (FLU A&B, COVID) ARPGX2
Influenza A by PCR: POSITIVE — AB
Influenza B by PCR: NEGATIVE
SARS Coronavirus 2 by RT PCR: NEGATIVE

## 2020-11-17 LAB — LACTIC ACID, PLASMA: Lactic Acid, Venous: 1.9 mmol/L (ref 0.5–1.9)

## 2020-11-17 MED ORDER — MORPHINE SULFATE (PF) 2 MG/ML IV SOLN: 2.0000 mg | INTRAVENOUS | Status: DC | PRN

## 2020-11-17 MED ORDER — AMLODIPINE BESYLATE 5 MG PO TABS
10.0000 mg | ORAL_TABLET | Freq: Every day | ORAL | Status: DC
  Administered 2020-11-18 – 2020-11-19 (×2): 10 mg via ORAL
  Filled 2020-11-17 (×2): qty 2

## 2020-11-17 MED ORDER — INSULIN ASPART 100 UNIT/ML IJ SOLN
0.0000 [IU] | Freq: Three times a day (TID) | INTRAMUSCULAR | Status: DC
  Administered 2020-11-19: 1 [IU] via SUBCUTANEOUS

## 2020-11-17 MED ORDER — ROSUVASTATIN CALCIUM 10 MG PO TABS
10.0000 mg | ORAL_TABLET | Freq: Every day | ORAL | Status: DC
  Administered 2020-11-18 – 2020-11-19 (×2): 10 mg via ORAL
  Filled 2020-11-17 (×2): qty 1

## 2020-11-17 MED ORDER — LOSARTAN POTASSIUM 25 MG PO TABS
100.0000 mg | ORAL_TABLET | Freq: Every day | ORAL | Status: DC
  Administered 2020-11-18 – 2020-11-19 (×2): 100 mg via ORAL
  Filled 2020-11-17 (×2): qty 4

## 2020-11-17 MED ORDER — ACETAMINOPHEN 325 MG PO TABS
650.0000 mg | ORAL_TABLET | ORAL | Status: DC | PRN
  Administered 2020-11-18 (×2): 650 mg via ORAL
  Filled 2020-11-17 (×2): qty 2

## 2020-11-17 MED ORDER — SODIUM CHLORIDE 0.9 % IV BOLUS
500.0000 mL | Freq: Once | INTRAVENOUS | Status: AC
  Administered 2020-11-17: 500 mL via INTRAVENOUS

## 2020-11-17 MED ORDER — OSELTAMIVIR PHOSPHATE 75 MG PO CAPS
75.0000 mg | ORAL_CAPSULE | Freq: Once | ORAL | Status: AC
  Administered 2020-11-17: 75 mg via ORAL
  Filled 2020-11-17: qty 1

## 2020-11-17 MED ORDER — ADULT MULTIVITAMIN W/MINERALS CH
1.0000 | ORAL_TABLET | Freq: Every day | ORAL | Status: DC
  Administered 2020-11-18 – 2020-11-19 (×2): 1 via ORAL
  Filled 2020-11-17 (×2): qty 1

## 2020-11-17 MED ORDER — HEPARIN SODIUM (PORCINE) 5000 UNIT/ML IJ SOLN
5000.0000 [IU] | Freq: Three times a day (TID) | INTRAMUSCULAR | Status: DC
  Administered 2020-11-18: 5000 [IU] via SUBCUTANEOUS
  Filled 2020-11-17: qty 1

## 2020-11-17 MED ORDER — ONDANSETRON HCL 4 MG/2ML IJ SOLN: 4.0000 mg | Freq: Four times a day (QID) | INTRAMUSCULAR | Status: DC | PRN

## 2020-11-17 MED ORDER — ACETAMINOPHEN 325 MG PO TABS
650.0000 mg | ORAL_TABLET | Freq: Once | ORAL | Status: AC
  Administered 2020-11-17: 650 mg via ORAL
  Filled 2020-11-17: qty 2

## 2020-11-17 NOTE — ED Provider Notes (Signed)
[facility] EMERGENCY DEPARTMENT Provider Note   CSN: [id number] Arrival date & time: 11/17/20  1743     History No chief complaint on file.   Marshell Cobler is a 80 y.o. female.  She is here with a complaint of cough and chest pain for 2 days.  Nonproductive.  She does not think she has had a fever.  The chest pain only occurs when she is coughing.  There is no pleuritic pain.  She is COVID vaccinated.  No sick contacts.  She told triage that she is also felt weak for 2 days.  The history is provided by the patient.  Cough Cough characteristics:  Non-productive Sputum characteristics:  Nondescript Severity:  Moderate Onset quality:  Gradual Duration:  3 days Timing:  Intermittent Progression:  Improving Chronicity:  New Smoker: no   Relieved by:  None tried Worsened by:  Nothing Ineffective treatments:  None tried Associated symptoms: chest pain and headaches   Associated symptoms: no diaphoresis, no fever, no rash, no rhinorrhea, no shortness of breath and no sore throat   Chest pain:    Quality: sharp     Severity:  Moderate   Onset quality:  Gradual   Duration:  2 days   Timing:  Intermittent (with cough)   Chronicity:  New     Past Medical History:  Diagnosis Date   Diabetes mellitus without complication ([place])    History of kidney stones    Hypertension     There are no problems to display for this patient.   Past Surgical History:  Procedure Laterality Date   CATARACT EXTRACTION W/PHACO Right 02/26/2018   Procedure: CATARACT EXTRACTION PHACO AND INTRAOCULAR LENS PLACEMENT RIGHT EYE (CDE: 9.94);  Surgeon: Baruch Goldmann, MD;  Location: [facility];  Service: Ophthalmology;  Laterality: Right;   CATARACT EXTRACTION W/PHACO Left 03/12/2018   Procedure: CATARACT EXTRACTION PHACO AND INTRAOCULAR LENS PLACEMENT (IOC);  Surgeon: Baruch Goldmann, MD;  Location: [facility];  Service: Ophthalmology;  Laterality: Left;  CDE: 11.25     OB History   No obstetric history on file.      No family history on file.  Social History   Tobacco Use   Smoking status: Never   Smokeless tobacco: Never  Vaping Use   Vaping Use: Never used  Substance Use Topics   Alcohol use: Never   Drug use: Never    Home Medications Prior to Admission medications   Medication Sig Start Date End Date Taking? Authorizing Provider  amLODipine (NORVASC) 10 MG tablet Take 10 mg by mouth daily.    [provider]  losartan (COZAAR) 100 MG tablet Take 100 mg by mouth daily.    [provider]  metFORMIN (GLUCOPHAGE) 500 MG tablet Take 500 mg by mouth 2 (two) times daily with a meal.    [provider]  Multiple Vitamin (MULTIVITAMIN WITH MINERALS) TABS tablet Take 1 tablet by mouth daily.    [provider]    Allergies    Patient has no known allergies.  Review of Systems   Review of Systems  Constitutional:  Negative for diaphoresis and fever.  HENT:  Negative for rhinorrhea and sore throat.   Eyes:  Negative for visual disturbance.  Respiratory:  Positive for cough. Negative for shortness of breath.   Cardiovascular:  Positive for chest pain.  Gastrointestinal:  Negative for abdominal pain.  Genitourinary:  Negative for dysuria.  Musculoskeletal:  Negative for neck pain.  Skin:  Negative for rash.  Neurological:  Positive for headaches.   Physical Exam Updated Vital Signs BP 138/77 (BP Location: Right Arm)   Pulse (!) 115   Temp (!) 100.9 F (38.3 C) (Oral)   Resp (!) 24   SpO2 90%   Physical Exam Vitals and nursing note reviewed.  Constitutional:      General: She is not in acute distress.    Appearance: Normal appearance. She is well-developed.  HENT:     Head: Normocephalic and atraumatic.  Eyes:     Conjunctiva/sclera: Conjunctivae normal.  Cardiovascular:     Rate and Rhythm: Normal rate and regular rhythm.     Pulses: Normal pulses.     Heart sounds: Murmur heard.  Pulmonary:     Effort: Pulmonary effort is normal. No  respiratory distress.     Breath sounds: Normal breath sounds.  Abdominal:     Palpations: Abdomen is soft.     Tenderness: There is no abdominal tenderness. There is no guarding or rebound.  Musculoskeletal:        General: Normal range of motion.     Cervical back: Neck supple.     Right lower leg: No edema.     Left lower leg: No edema.  Skin:    General: Skin is warm and dry.  Neurological:     General: No focal deficit present.     Mental Status: She is alert.    ED Results / Procedures / Treatments   Labs (all labs ordered are listed, but only abnormal results are displayed) Labs Reviewed  RESP PANEL BY RT-PCR (FLU A&B, COVID) ARPGX2 - Abnormal; Notable for the following components:      Result Value   Influenza A by PCR POSITIVE (*)    All other components within normal limits  COMPREHENSIVE METABOLIC PANEL - Abnormal; Notable for the following components:   Sodium 132 (*)    Glucose, Bld 161 (*)    GFR, Estimated 58 (*)    All other components within normal limits  URINALYSIS, ROUTINE W REFLEX MICROSCOPIC - Abnormal; Notable for the following components:   APPearance HAZY (*)    Leukocytes,Ua LARGE (*)    All other components within normal limits  GLUCOSE, CAPILLARY - Abnormal; Notable for the following components:   Glucose-Capillary 100 (*)    All other components within normal limits  BASIC METABOLIC PANEL - Abnormal; Notable for the following components:   Glucose, Bld 111 (*)    All other components within normal limits  TROPONIN I (HIGH SENSITIVITY) - Abnormal; Notable for the following components:   Troponin I (High Sensitivity) 508 (*)    All other components within normal limits  TROPONIN I (HIGH SENSITIVITY) - Abnormal; Notable for the following components:   Troponin I (High Sensitivity) 476 (*)    All other components within normal limits  TROPONIN I (HIGH SENSITIVITY) - Abnormal; Notable for the following components:   Troponin I (High Sensitivity)  505 (*)    All other components within normal limits  TROPONIN I (HIGH SENSITIVITY) - Abnormal; Notable for the following components:   Troponin I (High Sensitivity) 640 (*)    All other components within normal limits  TROPONIN I (HIGH SENSITIVITY) - Abnormal; Notable for the following components:   Troponin I (High Sensitivity) 596 (*)    All other components within normal limits  CULTURE, BLOOD (ROUTINE X 2)  CULTURE, BLOOD (ROUTINE X 2)  CBC WITH DIFFERENTIAL/PLATELET  LACTIC ACID, PLASMA  CBC  MAGNESIUM  PHOSPHORUS  HEMOGLOBIN A1C    EKG EKG Interpretation  Date/Time:  Tuesday November 17 2020 19:23:20 EDT Ventricular Rate:  101 PR Interval:  131 QRS Duration: 94 QT Interval:  338 QTC Calculation: 439 R Axis:   58 Text Interpretation: Sinus tachycardia no acute ST/Ts No significant change since prior 2/20 Confirmed by Aletta Edouard [phone]) on 11/17/2020 8:03:07 PM  Radiology DG Chest Port 1 View  Result Date: 11/17/2020 CLINICAL DATA:  Cough.  Fever and weakness for 2 days. EXAM: PORTABLE CHEST 1 VIEW COMPARISON:  None. FINDINGS: Cardiac silhouette is mildly enlarged. No mediastinal or hilar masses. Dense atherosclerotic calcification courses along the thoracic aorta. Mild linear opacities in the lung bases consistent with atelectasis or scarring. Remainder of the lungs is clear. No convincing pleural effusion and no pneumothorax. Skeletal structures are demineralized. Prominent dextroscoliosis of the thoracic spine. IMPRESSION: No acute cardiopulmonary disease. Electronically Signed   By: Lajean Manes M.D.   On: 11/17/2020 19:44    Procedures Procedures   Medications Ordered in ED Medications  amLODipine (NORVASC) tablet 10 mg (has no administration in time range)  losartan (COZAAR) tablet 100 mg (100 mg Oral Given 11/18/20 0819)  rosuvastatin (CRESTOR) tablet 10 mg (10 mg Oral Given 11/18/20 0820)  multivitamin with minerals tablet 1 tablet (1 tablet Oral Given  11/18/20 0819)  acetaminophen (TYLENOL) tablet 650 mg (650 mg Oral Given 11/18/20 0348)  ondansetron (ZOFRAN) injection 4 mg (has no administration in time range)  insulin aspart (novoLOG) injection 0-6 Units (0 Units Subcutaneous Not Given 11/18/20 [id number])  morphine 2 MG/ML injection 2 mg (has no administration in time range)  enoxaparin (LOVENOX) injection 40 mg (has no administration in time range)  oseltamivir (TAMIFLU) capsule 30 mg (has no administration in time range)  sodium chloride 0.9 % bolus 500 mL (0 mLs Intravenous Stopped 11/17/20 2044)  acetaminophen (TYLENOL) tablet 650 mg (650 mg Oral Given 11/17/20 1931)  oseltamivir (TAMIFLU) capsule 75 mg (75 mg Oral Given 11/17/20 2102)  aspirin tablet 325 mg (325 mg Oral Given 11/18/20 [id number])    ED Course  I have reviewed the triage vital signs and the nursing notes.  Pertinent labs & imaging results that were available during my care of the patient were reviewed by me and considered in my medical decision making (see chart for details).  Clinical Course as of 11/18/20 1005  Tue Nov 17, 2020  2048 Discussed with: Cardiology Dr. Marcelle Smiling.  He felt that the patient would be appropriate for [facility] to trend the troponins.  He reviewed the patient's EKG and did not see any concerning findings. [MB]  2108 Discussed with [facility] hospitalist Dr. Marylyn Ishihara [MB]    Clinical Course User Index [MB] Hayden Rasmussen, MD   MDM Rules/Calculators/A&P                          Kimoria Ebarb was evaluated in Emergency Department on 11/17/2020 for the symptoms described in the history of present illness. She was evaluated in the context of the global COVID-19 pandemic, which necessitated consideration that the patient might be at risk for infection with the SARS-CoV-2 virus that causes COVID-19. Institutional protocols and algorithms that pertain to the evaluation of patients at risk for COVID-19 are in a state of rapid change based on information released by  regulatory bodies including the CDC and federal and state organizations. These policies and algorithms were followed during the patient's care in the ED.  This patient complains of cough headache chest pain with cough fever; this involves an extensive number of treatment Options and is a complaint that carries with it a high risk of complications and Morbidity. The differential includes COVID flu pneumonia ACS PE viral syndrome  I ordered, reviewed and interpreted labs, which included CBC with normal white count normal hemoglobin, chemistries mildly low sodium elevated glucose, blood cultures pending, lactate not elevated, troponins elevated will need to be trended, COVID-negative flu positive I ordered medication Tylenol IV fluids Tamiflu I ordered imaging studies which included chest x-ray and I independently    visualized and interpreted imaging which showed no acute findings Previous records obtained and reviewed in epic no recent admissions I consulted cardiology Dr. Hyacinth Meeker and [facility] hospitalist Dr.Zierle-Ghosh And discussed lab and imaging findings  Critical Interventions: None  After the interventions stated above, I reevaluated the patient and found patient to be hemodynamically stable.  Due to the elevated troponins feel she would be better served by being observed in the hospital and having these trended.  Patient in agreement with plan.   Final Clinical Impression(s) / ED Diagnoses Final diagnoses:  Influenza with respiratory manifestation  Elevated troponin    Rx / DC Orders ED Discharge Orders     None        Terrilee Files, MD 11/18/20 1012

## 2020-11-17 NOTE — ED Triage Notes (Signed)
Fever, weak for 2 days

## 2020-11-17 NOTE — H&P (Signed)
History and Physical  [facility]  [name] [id number] DOB: [birth year] DOA: 11/17/2020  PCP: Bary Leriche, PA-C  Patient coming from: Home Level of care:   Chief Complaint: Influenza A / elevated cardiac enzyme.  HPI: Kelly Clarke is a 80 y.o. female with medical history significant for diabetes mellitus, hypertension, and hypercholesterolemia who presents to the ED from home with a 2-day history of fever and cough.  Patient states that she was taking care of her grandson last week who was positive for the flu.  Patient states that she became febrile on Sunday.  Patient states that she took Tylenol at home and that helped with reducing her fever, but did not improve her cough prompting her to come to the emergency department.  Upon presentation to the ED, patient was febrile with temperature of 100.9.  Patient's respiratory panel showed that the patient was positive for influenza a.  Patient has not received her flu shot this year.  Patient has been vaccinated against COVID-19.  Patient was also tachycardic with a heart rate of 102.  She was found to have elevated troponin of 508, second troponin pending.  EKG showed sinus tachycardia with a heart rate of 101 bpm, QTC of 439.  Patient denies chest pains, palpitations or nausea/vomiting.   ED Course: Upon presentation patient was febrile with a temperature of 101.9 and tachycardic with a heart rate of 101.  EKG showed sinus tachycardia.  Patient found to have elevated troponin of 508; second troponin pending.  Respiratory panel positive for influenza A.  Patient was given Tamiflu andTylenol.  Review of Systems: Review of Systems  Constitutional:  Positive for fever and malaise/fatigue. Negative for diaphoresis and weight loss.  Eyes: Negative.   Respiratory:  Positive for cough, shortness of breath and wheezing. Negative for sputum production.   Cardiovascular: Negative.  Negative for chest pain and palpitations.  Gastrointestinal:  Negative.  Negative for abdominal pain, diarrhea, heartburn, nausea and vomiting.  Genitourinary: Negative.  Negative for frequency and urgency.  Musculoskeletal: Negative.   Skin: Negative.   Neurological: Negative.   Endo/Heme/Allergies: Negative.   Psychiatric/Behavioral: Negative.      Past Medical History:  Diagnosis Date   Diabetes mellitus without complication ([facility])    History of kidney stones    Hypertension     Past Surgical History:  Procedure Laterality Date   CATARACT EXTRACTION W/PHACO Right 02/26/2018   Procedure: CATARACT EXTRACTION PHACO AND INTRAOCULAR LENS PLACEMENT RIGHT EYE (CDE: 9.94);  Surgeon: Fabio Pierce, MD;  Location: [facility];  Service: Ophthalmology;  Laterality: Right;   CATARACT EXTRACTION W/PHACO Left 03/12/2018   Procedure: CATARACT EXTRACTION PHACO AND INTRAOCULAR LENS PLACEMENT (IOC);  Surgeon: Fabio Pierce, MD;  Location: [facility];  Service: Ophthalmology;  Laterality: Left;  CDE: 11.25     reports that she has never smoked. She has never used smokeless tobacco. She reports that she does not drink alcohol and does not use drugs.  No Known Allergies  No family history on file.  Prior to Admission medications   Medication Sig Start Date End Date Taking? Authorizing Provider  amLODipine (NORVASC) 10 MG tablet Take 10 mg by mouth daily.    [provider]  losartan (COZAAR) 100 MG tablet Take 100 mg by mouth daily.    [provider]  metFORMIN (GLUCOPHAGE) 500 MG tablet Take 500 mg by mouth 2 (, hypertension, and hypercholesterolemia) times daily with a meal.    [provider]  metFORMIN (GLUCOPHAGE-XR) 500  MG 24 hr tablet Take 500 mg by mouth daily. 09/10/20   [provider]  Multiple Vitamin (MULTIVITAMIN WITH MINERALS) TABS tablet Take 1 tablet by mouth daily.    [provider]  rosuvastatin (CRESTOR) 10 MG tablet Take by mouth. 09/14/20   [provider]    Physical Exam: Vitals:    11/17/20 1930 11/17/20 2000 11/17/20 2030 11/17/20 2100  BP: 139/80 131/72 127/68 137/72  Pulse: (!) 102 95 100 94  Resp: (!) 26 20 18 20   Temp:      TempSrc:      SpO2: 95% 92% (!) 89% 96%    Constitutional: NAD, calm, comfortable Eyes: PERRL, lids and conjunctivae normal ENMT: Mucous membranes are moist. Posterior pharynx clear of any exudate or lesions.Normal dentition.  Neck: normal, supple, no masses, no thyromegaly Respiratory: Expiratory wheeze present.  Cough present. normal respiratory effort. No accessory muscle use.  Cardiovascular: normal s1, s2 sounds, blowing murmur present.  No rubs / gallops. No extremity edema. 2+ pedal pulses. No carotid bruits.  Abdomen: no tenderness, no masses palpated. No hepatosplenomegaly. Bowel sounds positive.  Musculoskeletal: no clubbing / cyanosis. No joint deformity upper and lower extremities. Good ROM, no contractures. Normal muscle tone.  Skin: no rashes, lesions, ulcers. No induration Neurologic: CN 2-12 grossly intact. Sensation intact, DTR normal. Strength 5/5 in all 4.  Psychiatric: Normal judgment and insight. Alert and oriented x 3. Normal mood.   Labs on Admission: I have personally reviewed following labs and imaging studies  CBC: Recent Labs  Lab 11/17/20 1919  WBC 7.3  NEUTROABS 5.4  HGB 12.1  HCT 36.9  MCV 80.4  PLT 210   Basic Metabolic Panel: Recent Labs  Lab 11/17/20 1919  NA 132*  K 4.2  CL 98  CO2 26  GLUCOSE 161*  BUN 17  CREATININE 0.98  CALCIUM 9.4   GFR: CrCl cannot be calculated (Unknown ideal weight.). Liver Function Tests: Recent Labs  Lab 11/17/20 1919  AST 32  ALT 19  ALKPHOS 61  BILITOT 0.5  PROT 7.6  ALBUMIN 3.9   No results for input(s): LIPASE, AMYLASE in the last 168 hours. No results for input(s): AMMONIA in the last 168 hours. Coagulation Profile: No results for input(s): INR, PROTIME in the last 168 hours. Cardiac Enzymes: No results for input(s): CKTOTAL, CKMB,  CKMBINDEX, TROPONINI in the last 168 hours. BNP (last 3 results) No results for input(s): PROBNP in the last 8760 hours. HbA1C: No results for input(s): HGBA1C in the last 72 hours. CBG: No results for input(s): GLUCAP in the last 168 hours. Lipid Profile: No results for input(s): CHOL, HDL, LDLCALC, TRIG, CHOLHDL, LDLDIRECT in the last 72 hours. Thyroid Function Tests: No results for input(s): TSH, T4TOTAL, FREET4, T3FREE, THYROIDAB in the last 72 hours. Anemia Panel: No results for input(s): VITAMINB12, FOLATE, FERRITIN, TIBC, IRON, RETICCTPCT in the last 72 hours. Urine analysis:    Component Value Date/Time   COLORURINE YELLOW 11/17/2020 2016   APPEARANCEUR HAZY (A) 11/17/2020 2016   LABSPEC 1.013 11/17/2020 2016   PHURINE 5.0 11/17/2020 2016   GLUCOSEU NEGATIVE 11/17/2020 2016   HGBUR NEGATIVE 11/17/2020 2016   BILIRUBINUR NEGATIVE 11/17/2020 2016   KETONESUR NEGATIVE 11/17/2020 2016   PROTEINUR NEGATIVE 11/17/2020 2016   NITRITE NEGATIVE 11/17/2020 2016   LEUKOCYTESUR LARGE (A) 11/17/2020 2016    Radiological Exams on Admission: DG Chest Port 1 View  Result Date: 11/17/2020 CLINICAL DATA:  Cough.  Fever and weakness for 2 days.  EXAM: PORTABLE CHEST 1 VIEW COMPARISON:  None. FINDINGS: Cardiac silhouette is mildly enlarged. No mediastinal or hilar masses. Dense atherosclerotic calcification courses along the thoracic aorta. Mild linear opacities in the lung bases consistent with atelectasis or scarring. Remainder of the lungs is clear. No convincing pleural effusion and no pneumothorax. Skeletal structures are demineralized. Prominent dextroscoliosis of the thoracic spine. IMPRESSION: No acute cardiopulmonary disease. Electronically Signed   By: Amie Portland M.D.   On: 11/17/2020 19:44    EKG: Independently reviewed.   Assessment/Plan Active Problems:   * No active hospital problems. *     Influenza A - Respiratory panel positive for influenza A - Continue Tamiflu 75  mg -Tylenol for antipyretic   Elevated troponin -First troponin of 508; second troponin pending.  EKG sinus tachycardia without acute ST elevation or depression.  Patient denies chest pain or palpitations. Elevation in troponin likely secondary to increased demand caused by patient's influenza infection.  -Second troponin pending -Cardiac monitoring   Hyponatremia -Likely due to poor p.o. intake due to influenza A. -Patient is status post 500 mL normal saline bolus. -Encourage p.o. intake.   Hypertension -Continue home amlodipine 10 mg daily -Continue home losartan 100 mg daily   Diabetes mellitus -Hold home metformin -Carb modified diet -SSI per hospital protocol  Hypercholesterolemia -Continue home atorvastatin 10 mg daily    DVT prophylaxis: SCDs Code Status: Full code Family Communication: No family at bedside Disposition Plan: Discharge home likely 24 to 36 hours. Consults called: N/A Admission status: Observation Level of care:  Doran Clay MS4  11/17/2020, 9:40 PM

## 2020-11-18 ENCOUNTER — Observation Stay (HOSPITAL_BASED_OUTPATIENT_CLINIC_OR_DEPARTMENT_OTHER): Payer: Medicare HMO

## 2020-11-18 DIAGNOSIS — R0602 Shortness of breath: Secondary | ICD-10-CM

## 2020-11-18 DIAGNOSIS — J09X2 Influenza due to identified novel influenza A virus with other respiratory manifestations: Secondary | ICD-10-CM | POA: Diagnosis not present

## 2020-11-18 DIAGNOSIS — Z79899 Other long term (current) drug therapy: Secondary | ICD-10-CM | POA: Diagnosis not present

## 2020-11-18 DIAGNOSIS — I10 Essential (primary) hypertension: Secondary | ICD-10-CM | POA: Diagnosis not present

## 2020-11-18 DIAGNOSIS — E119 Type 2 diabetes mellitus without complications: Secondary | ICD-10-CM | POA: Diagnosis not present

## 2020-11-18 DIAGNOSIS — Z20822 Contact with and (suspected) exposure to covid-19: Secondary | ICD-10-CM | POA: Diagnosis not present

## 2020-11-18 DIAGNOSIS — R778 Other specified abnormalities of plasma proteins: Secondary | ICD-10-CM | POA: Diagnosis not present

## 2020-11-18 DIAGNOSIS — Z7984 Long term (current) use of oral hypoglycemic drugs: Secondary | ICD-10-CM | POA: Diagnosis not present

## 2020-11-18 LAB — ECHOCARDIOGRAM COMPLETE
AR max vel: 1.43 cm2
AV Area VTI: 1.51 cm2
AV Area mean vel: 1.49 cm2
AV Mean grad: 12 mmHg
AV Peak grad: 21.6 mmHg
Ao pk vel: 2.33 m/s
Area-P 1/2: 4.93 cm2
Height: 63 in
MV VTI: 1.83 cm2
S' Lateral: 2.4 cm
Weight: 2127 oz

## 2020-11-18 LAB — BASIC METABOLIC PANEL
Anion gap: 6 (ref 5–15)
BUN: 15 mg/dL (ref 8–23)
CO2: 29 mmol/L (ref 22–32)
Calcium: 8.9 mg/dL (ref 8.9–10.3)
Chloride: 101 mmol/L (ref 98–111)
Creatinine, Ser: 0.92 mg/dL (ref 0.44–1.00)
GFR, Estimated: >60 mL/min (ref >60)
Glucose, Bld: 111 mg/dL — ABNORMAL HIGH (ref 70–99)
Potassium: 4.2 mmol/L (ref 3.5–5.1)
Sodium: 136 mmol/L (ref 135–145)

## 2020-11-18 LAB — TROPONIN I (HIGH SENSITIVITY)
Troponin I (High Sensitivity): 505 ng/L (ref <18)
Troponin I (High Sensitivity): 640 ng/L (ref <18)
Troponin I (High Sensitivity): 596 ng/L (ref <18)

## 2020-11-18 LAB — CBC
HCT: 36.6 % (ref 36.0–46.0)
Hemoglobin: 12 g/dL (ref 12.0–15.0)
MCH: 27.3 pg (ref 26.0–34.0)
MCHC: 32.8 g/dL (ref 30.0–36.0)
MCV: 83.2 fL (ref 80.0–100.0)
Platelets: 206 10*3/uL (ref 150–400)
RBC: 4.4 MIL/uL (ref 3.87–5.11)
RDW: 12.9 % (ref 11.5–15.5)
WBC: 5.5 10*3/uL (ref 4.0–10.5)
nRBC: 0 % (ref 0.0–0.2)

## 2020-11-18 LAB — PHOSPHORUS: Phosphorus: 3.2 mg/dL (ref 2.5–4.6)

## 2020-11-18 LAB — HEMOGLOBIN A1C
Hgb A1c MFr Bld: 6.8 % — ABNORMAL HIGH (ref 4.8–5.6)
Mean Plasma Glucose: 148.46 mg/dL

## 2020-11-18 LAB — GLUCOSE, CAPILLARY
Glucose-Capillary: 100 mg/dL — ABNORMAL HIGH (ref 70–99)
Glucose-Capillary: 124 mg/dL — ABNORMAL HIGH (ref 70–99)
Glucose-Capillary: 109 mg/dL — ABNORMAL HIGH (ref 70–99)
Glucose-Capillary: 98 mg/dL (ref 70–99)

## 2020-11-18 LAB — MAGNESIUM: Magnesium: 2.2 mg/dL (ref 1.7–2.4)

## 2020-11-18 MED ORDER — ENOXAPARIN SODIUM 40 MG/0.4ML IJ SOSY
40.0000 mg | PREFILLED_SYRINGE | INTRAMUSCULAR | Status: DC
  Administered 2020-11-18: 40 mg via SUBCUTANEOUS
  Filled 2020-11-18: qty 0.4

## 2020-11-18 MED ORDER — ASPIRIN 325 MG PO TABS
325.0000 mg | ORAL_TABLET | ORAL | Status: AC
  Administered 2020-11-18: 325 mg via ORAL
  Filled 2020-11-18: qty 1

## 2020-11-18 MED ORDER — OSELTAMIVIR PHOSPHATE 30 MG PO CAPS
30.0000 mg | ORAL_CAPSULE | Freq: Two times a day (BID) | ORAL | Status: DC
  Administered 2020-11-18 – 2020-11-19 (×2): 30 mg via ORAL
  Filled 2020-11-18 (×6): qty 1

## 2020-11-18 NOTE — Progress Notes (Signed)
Date and time results received: 11/18/20 0005 (use smartphrase ".now" to insert current time)  Test: lab Critical Value: trop 505  Name of Provider Notified: Dr. Victorino Dike  Orders Received? Or Actions Taken?: Actions Taken: MD on call notified

## 2020-11-18 NOTE — Progress Notes (Signed)
Date and time results received: 11/18/20 0700 (use smartphrase ".now" to insert current time)  Test: tropin  Critical Value: 640  Name of Provider Notified: Darlin Drop, DO  Orders Received? Or Actions Taken?: Orders Received - See Orders for details

## 2020-11-18 NOTE — Progress Notes (Signed)
  Echocardiogram 2D Echocardiogram has been performed.  [name] 11/18/2020, 2:55 PM

## 2020-11-18 NOTE — Progress Notes (Signed)
PROGRESS NOTE  Kelly Clarke GLO:[id number] DOB: December 17, 1940 DOA: 11/17/2020 PCP: Bary Leriche, PA-C  HPI/Recap of past 24 hours: Kelly Clarke is a 80 y.o. female with medical history significant for type II diabetes mellitus, hypertension, and hypercholesterolemia who presents to [facility] from home with a 2-day history of fever and cough.  On presentation to the ED, tachycardic, febrile with T-max 101.9, troponin greater than 500, twelve-lead EKG with sinus tachycardia.  Influenza A+, started on Tamiflu.   11/18/2020: Patient denies chest pain, shortness of breath or palpitations.  Cough is improved.  She has no new complaints.    Troponin noted to be uptrending this morning, greater than 600.  Febrile with T-max of 102.4.  Will repeat troponin and obtain stat 12 lead EKG.  2D echo has also been ordered.  Assessment/Plan: Active Problems:   Elevated troponin   Influenza A   HTN (hypertension)   DMII (diabetes mellitus, type 2) (HCC)  Influenza A viral infection Presented with 2-day history of fever and cough Influenza A+. T-max 102.4 this morning. Started on Tamiflu 75 mg on 11/17/2020, continue. Antipyretics as needed.  Elevated troponin, suspect demand ischemia in the setting of tachycardia and acute illness Initial troponin greater than 500, uptrending to 640 this morning. Full dose aspirin given 325 mg x 1. Patient is asymptomatic, she denies any anginal symptoms. Repeat troponin, follow. Obtain stat twelve-lead EKG. Obtain 2D echo, follow. Fasting lipid panel in the morning.  Type 2 diabetes with hyperglycemia Of the hemoglobin A1c Continue insulin sliding scale.  Essential hypertension BP is at goal Continue home oral antihypertensives Continue to monitor vital signs.  Mild hyponatremia Presented with serum sodium of 132 Encourage oral intake Repeat BMP this morning.  Hyperlipidemia Follow lipid panel in the morning Continue home Crestor.   Code Status: Full  code.  Family Communication: None at bedside  Disposition Plan: Likely will discharge to home   Consultants: None  Procedures: None  Antimicrobials: Started on Tamiflu on 11/17/20.  DVT prophylaxis: Subcu Lovenox daily  Status is: Observation-we will request inpatient.  Patient has elevated troponin uptrending.  Patient will require at least 2 midnights for further evaluation and treatment of present condition.       Objective: Vitals:   11/17/20 2311 11/17/20 2322 11/17/20 2327 11/18/20 0330  BP:   (!) 145/82 139/72  Pulse:   95 96  Resp:   19 19  Temp: 99.4 F (37.4 C)  99.6 F (37.6 C) (!) 102.4 F (39.1 C)  TempSrc: Oral  Oral Oral  SpO2:   98% 95%  Weight:  60.3 kg    Height:  5\' 3"  (1.6 m)      Intake/Output Summary (Last 24 hours) at 11/18/2020 13/02/2020 Last data filed at 11/17/2020 2044 Gross per 24 hour  Intake 500 ml  Output --  Net 500 ml   Filed Weights   11/17/20 2322  Weight: 60.3 kg    Exam:  General: 80 y.o. year-old female well developed well nourished in no acute distress.  Alert and oriented x3. Cardiovascular: Regular rate and rhythm with no rubs or gallops.  No thyromegaly or JVD noted.   Respiratory: Faint wheezing bilaterally. Good inspiratory effort. Abdomen: Soft nontender nondistended with normal bowel sounds x4 quadrants. Musculoskeletal: No lower extremity edema. 2/4 pulses in all 4 extremities. Skin: No ulcerative lesions noted or rashes, Psychiatry: Mood is appropriate for condition and setting   Data Reviewed: CBC: Recent Labs  Lab 11/17/20 1919  WBC  7.3  NEUTROABS 5.4  HGB 12.1  HCT 36.9  MCV 80.4  PLT 210   Basic Metabolic Panel: Recent Labs  Lab 11/17/20 1919  NA 132*  K 4.2  CL 98  CO2 26  GLUCOSE 161*  BUN 17  CREATININE 0.98  CALCIUM 9.4   GFR: Estimated Creatinine Clearance: 37.2 mL/min (by C-G formula based on SCr of 0.98 mg/dL). Liver Function Tests: Recent Labs  Lab 11/17/20 1919  AST  32  ALT 19  ALKPHOS 61  BILITOT 0.5  PROT 7.6  ALBUMIN 3.9   No results for input(s): LIPASE, AMYLASE in the last 168 hours. No results for input(s): AMMONIA in the last 168 hours. Coagulation Profile: No results for input(s): INR, PROTIME in the last 168 hours. Cardiac Enzymes: No results for input(s): CKTOTAL, CKMB, CKMBINDEX, TROPONINI in the last 168 hours. BNP (last 3 results) No results for input(s): PROBNP in the last 8760 hours. HbA1C: No results for input(s): HGBA1C in the last 72 hours. CBG: No results for input(s): GLUCAP in the last 168 hours. Lipid Profile: No results for input(s): CHOL, HDL, LDLCALC, TRIG, CHOLHDL, LDLDIRECT in the last 72 hours. Thyroid Function Tests: No results for input(s): TSH, T4TOTAL, FREET4, T3FREE, THYROIDAB in the last 72 hours. Anemia Panel: No results for input(s): VITAMINB12, FOLATE, FERRITIN, TIBC, IRON, RETICCTPCT in the last 72 hours. Urine analysis:    Component Value Date/Time   COLORURINE YELLOW 11/17/2020 2016   APPEARANCEUR HAZY (A) 11/17/2020 2016   LABSPEC 1.013 11/17/2020 2016   PHURINE 5.0 11/17/2020 2016   GLUCOSEU NEGATIVE 11/17/2020 2016   HGBUR NEGATIVE 11/17/2020 2016   BILIRUBINUR NEGATIVE 11/17/2020 2016   KETONESUR NEGATIVE 11/17/2020 2016   PROTEINUR NEGATIVE 11/17/2020 2016   NITRITE NEGATIVE 11/17/2020 2016   LEUKOCYTESUR LARGE (A) 11/17/2020 2016   Sepsis Labs: @LABRCNTIP (procalcitonin:4,lacticidven:4)  ) Recent Results (from the past 240 hour(s))  Culture, blood (routine x 2)     Status: None (Preliminary result)   Collection Time: 11/17/20  7:19 PM   Specimen: BLOOD  Result Value Ref Range Status   Specimen Description BLOOD LEFT ANTECUBITAL  Final   Special Requests   Final    BOTTLES DRAWN AEROBIC AND ANAEROBIC Blood Culture results may not be optimal due to an excessive volume of blood received in culture bottles Performed at [facility], [address], [place], [place]    Culture  PENDING  Incomplete   Report Status PENDING  Incomplete  Culture, blood (routine x 2)     Status: None (Preliminary result)   Collection Time: 11/17/20  7:22 PM   Specimen: BLOOD  Result Value Ref Range Status   Specimen Description BLOOD BLOOD LEFT FOREARM  Final   Special Requests   Final    BOTTLES DRAWN AEROBIC AND ANAEROBIC Blood Culture adequate volume Performed at [facility], [address], [place], [place]    Culture PENDING  Incomplete   Report Status PENDING  Incomplete  Resp Panel by RT-PCR (Flu A&B, Covid) Nasopharyngeal Swab     Status: Abnormal   Collection Time: 11/17/20  7:26 PM   Specimen: Nasopharyngeal Swab; Nasopharyngeal(NP) swabs in vial transport medium  Result Value Ref Range Status   SARS Coronavirus 2 by RT PCR NEGATIVE NEGATIVE Final    Comment: (NOTE) SARS-CoV-2 target nucleic acids are NOT DETECTED.  The SARS-CoV-2 RNA is generally detectable in upper respiratory specimens during the acute phase of infection. The lowest concentration of SARS-CoV-2 viral copies this assay  can detect is 138 copies/mL. A negative result does not preclude SARS-Cov-2 infection and should not be used as the sole basis for treatment or other patient management decisions. A negative result may occur with  improper specimen collection/handling, submission of specimen other than nasopharyngeal swab, presence of viral mutation(s) within the areas targeted by this assay, and inadequate number of viral copies(<138 copies/mL). A negative result must be combined with clinical observations, patient history, and epidemiological information. The expected result is Negative.  Fact Sheet for Patients:  BloggerCourse.com  Fact Sheet for Healthcare Providers:  SeriousBroker.it  This test is no t yet approved or cleared by the [place] FDA and  has been authorized for detection and/or diagnosis of SARS-CoV-2 by FDA under  an Emergency Use Authorization (EUA). This EUA will remain  in effect (meaning this test can be used) for the duration of the COVID-19 declaration under Section 564(b)(1) of the Act, 21 U.S.C.section 360bbb-3(b)(1), unless the authorization is terminated  or revoked sooner.       Influenza A by PCR POSITIVE (A) NEGATIVE Final   Influenza B by PCR NEGATIVE NEGATIVE Final    Comment: (NOTE) The Xpert Xpress SARS-CoV-2/FLU/RSV plus assay is intended as an aid in the diagnosis of influenza from Nasopharyngeal swab specimens and should not be used as a sole basis for treatment. Nasal washings and aspirates are unacceptable for Xpert Xpress SARS-CoV-2/FLU/RSV testing.  Fact Sheet for Patients: BloggerCourse.com  Fact Sheet for Healthcare Providers: SeriousBroker.it  This test is not yet approved or cleared by the [place] FDA and has been authorized for detection and/or diagnosis of SARS-CoV-2 by FDA under an Emergency Use Authorization (EUA). This EUA will remain in effect (meaning this test can be used) for the duration of the COVID-19 declaration under Section 564(b)(1) of the Act, 21 U.S.C. section 360bbb-3(b)(1), unless the authorization is terminated or revoked.  Performed at [facility], [address], [place], [place] [address]       Studies: DG Chest [facility] 1 View  Result Date: 11/17/2020 CLINICAL DATA:  Cough.  Fever and weakness for 2 days. EXAM: PORTABLE CHEST 1 VIEW COMPARISON:  None. FINDINGS: Cardiac silhouette is mildly enlarged. No mediastinal or hilar masses. Dense atherosclerotic calcification courses along the thoracic aorta. Mild linear opacities in the lung bases consistent with atelectasis or scarring. Remainder of the lungs is clear. No convincing pleural effusion and no pneumothorax. Skeletal structures are demineralized. Prominent dextroscoliosis of the thoracic spine. IMPRESSION: No acute cardiopulmonary  disease. Electronically Signed   By: Amie Portland M.D.   On: 11/17/2020 19:44    Scheduled Meds:  amLODipine  10 mg Oral Daily   heparin  5,000 Units Subcutaneous Q8H   insulin aspart  0-6 Units Subcutaneous TID WC   losartan  100 mg Oral Daily   multivitamin with minerals  1 tablet Oral Daily   rosuvastatin  10 mg Oral Daily    Continuous Infusions:   LOS: 0 days     Darlin Drop, MD [facility] Pager [phone]  If 7PM-7AM, please contact night-coverage [email or web address] Password [facility] 11/18/2020, 7:28 AM

## 2020-11-19 DIAGNOSIS — E119 Type 2 diabetes mellitus without complications: Secondary | ICD-10-CM | POA: Diagnosis not present

## 2020-11-19 LAB — LIPID PANEL
Cholesterol: 213 mg/dL — ABNORMAL HIGH (ref 0–200)
HDL: 38 mg/dL — ABNORMAL LOW
LDL Cholesterol: 144 mg/dL — ABNORMAL HIGH (ref 0–99)
Total CHOL/HDL Ratio: 5.6 ratio
Triglycerides: 153 mg/dL — ABNORMAL HIGH
VLDL: 31 mg/dL (ref 0–40)

## 2020-11-19 LAB — GLUCOSE, CAPILLARY
Glucose-Capillary: 105 mg/dL — ABNORMAL HIGH (ref 70–99)
Glucose-Capillary: 179 mg/dL — ABNORMAL HIGH (ref 70–99)

## 2020-11-19 MED ORDER — ROSUVASTATIN CALCIUM 20 MG PO TABS: 20.0000 mg | ORAL_TABLET | Freq: Every day | ORAL | 0 refills | Status: DC

## 2020-11-19 MED ORDER — OSELTAMIVIR PHOSPHATE 30 MG PO CAPS: 30.0000 mg | ORAL_CAPSULE | Freq: Two times a day (BID) | ORAL | 0 refills | Status: AC

## 2020-11-19 MED ORDER — ROSUVASTATIN CALCIUM 20 MG PO TABS: 20.0000 mg | ORAL_TABLET | Freq: Every day | ORAL | Status: DC

## 2020-11-19 NOTE — Care Management Obs Status (Signed)
MEDICARE OBSERVATION STATUS NOTIFICATION   Patient Details  Name: Kelly Clarke MRN: [id number] Date of Birth: 1940-03-19   Medicare Observation Status Notification Given:  Yes    Corey Harold 11/19/2020, 9:47 AM

## 2020-11-19 NOTE — Discharge Summary (Signed)
Discharge Summary  Kelly Clarke WUJ:[id number] DOB: [birth year]  PCP: Bary Leriche, PA-C  Admit date: 11/17/2020 Discharge date: 11/19/2020  Time spent: 35 minutes.  Recommendations for Outpatient Follow-up:  Follow-up with cardiology, keep your appointment with Dr. Gaynelle Arabian on 12/04/2020 at 10 AM. Follow-up with your PCP Take your medications as prescribed  Discharge Diagnoses:  Active Hospital Problems   Diagnosis Date Noted   Elevated troponin 11/17/2020   Influenza A 11/17/2020   HTN (hypertension) 11/17/2020   DMII (diabetes mellitus, type 2) (HCC) 11/17/2020    Resolved Hospital Problems  No resolved problems to display.    Discharge Condition: Stable  Diet recommendation: Resume previous diet.  Vitals:   11/18/20 2104 11/19/20 0556  BP: (!) 148/80 (!) 169/93  Pulse: 93 97  Resp: 18 20  Temp: 98.8 F (37.1 C) 98.6 F (37 C)  SpO2: 92% 96%    History of present illness:  Kelly Clarke is a 80 y.o. female with medical history significant for type II diabetes mellitus, hypertension, hypercholesterolemia, heart murmur, who presented to [facility] from home with a 2-day history of fever and cough.  On presentation to the ED, tachycardic, febrile with T-max 101.9, troponin greater than 500, twelve-lead EKG with sinus tachycardia.  Influenza A+, started on Tamiflu.  Had a 2D echo for elevated troponin greater than 600.    2D echo done on 11/18/2020 showed the aortic valve is tricuspid, severe calcification of the aortic valve, severe thickening of the aortic valve, moderate to severe aortic stenosis present.  Mild to moderate mitral valve regurgitation, mild to moderate mitral valve stenosis.  LVEF greater than 75%.  The left ventricle has hyperdynamic function.  The left ventricle has no regional wall motion abnormalities.  Left ventricular internal cavity size was small.  There is no left ventricular hypertrophy.   11/19/2020: Patient was seen at her bedside.  There were no  events overnight.  She has no new complaints.  She denies any chest pain, no shortness of breath at the time of this visit.  Cough has improved.  Curb sided with cardiology regarding the results of 2D echo, Dr. Diona Browner.  An appointment has been made for the patient for 12/04/2020 with Dr. Bjorn Pippin.  Patient and daughter made aware.  They understand and agree with the plan.  Hospital Course:  Active Problems:   Elevated troponin   Influenza A   HTN (hypertension)   DMII (diabetes mellitus, type 2) (HCC)  Influenza A viral infection Presented with 2-day history of fever and cough Influenza A+. Complete course of Tamiflu x5 days.   Elevated troponin, suspect demand ischemia in the setting of tachycardia and acute illness Initial troponin greater than 500, peaked at 640 and trended down.  She denies any anginal symptoms.   Twelve-lead EKG nonacute. 2D echo with no regional wall motion abnormalities.  Moderate to severe aortic stenosis, tricuspid valve. Follow-up with cardiology outpatient, appointment made. Patient understands and agrees with the plan.   Type 2 diabetes with hyperglycemia Hemoglobin A1c 6.8 on 11/18/2020. Resume home regimen.   Essential hypertension Continue home oral antihypertensives Follow-up with your PCP.   Resolved mild hyponatremia Presented with serum sodium of 132 Repeat sodium 136 on 11/18/2020.   Hyperlipidemia Unclear if she was compliant with her home Crestor. LDL 144 on 11/19/2020 Crestor dose increased to 20 mg daily. Follow-up with cardiology.     Code Status: Full code.   Family Communication: Updated her daughter Bjorn Loser via phone.  Consultants: Curb sided with cardiology, Dr. Diona Browner.  An appointment has been made for the patient for 12/04/2020 with Dr. Bjorn Pippin.  Patient and daughter made aware.  They understand and agree with the plan.   Procedures: 2D echo   Antimicrobials: Started on Tamiflu on 11/17/20.   Discharge  Exam: BP (!) 169/93 (BP Location: Left Arm)   Pulse 97   Temp 98.6 F (37 C) (Oral)   Resp 20   Ht 5\' 3"  (1.6 m)   Wt 60.3 kg   SpO2 96%   BMI 23.55 kg/m  General: 80 y.o. year-old female well developed well nourished in no acute distress.  Alert and oriented x3. Cardiovascular: Regular rate and rhythm with no rubs or gallops.  No thyromegaly or JVD noted.   Respiratory: Clear to auscultation with no wheezes or rales. Good inspiratory effort. Abdomen: Soft nontender nondistended with normal bowel sounds x4 quadrants. Musculoskeletal: No lower extremity edema. 2/4 pulses in all 4 extremities. Skin: No ulcerative lesions noted or rashes, Psychiatry: Mood is appropriate for condition and setting  Discharge Instructions You were cared for by a hospitalist during your hospital stay. If you have any questions about your discharge medications or the care you received while you were in the hospital after you are discharged, you can call the unit and asked to speak with the hospitalist on call if the hospitalist that took care of you is not available. Once you are discharged, your primary care physician will handle any further medical issues. Please note that NO REFILLS for any discharge medications will be authorized once you are discharged, as it is imperative that you return to your primary care physician (or establish a relationship with a primary care physician if you do not have one) for your aftercare needs so that they can reassess your need for medications and monitor your lab values.   Allergies as of 11/19/2020   No Known Allergies      Medication List     TAKE these medications    amLODipine 10 MG tablet Commonly known as: NORVASC Take 10 mg by mouth daily.   losartan 100 MG tablet Commonly known as: COZAAR Take 100 mg by mouth daily.   metFORMIN 500 MG 24 hr tablet Commonly known as: GLUCOPHAGE-XR Take 500 mg by mouth daily. What changed: Another medication with the  same name was removed. Continue taking this medication, and follow the directions you see here.   multivitamin with minerals Tabs tablet Take 1 tablet by mouth daily.   oseltamivir 30 MG capsule Commonly known as: TAMIFLU Take 1 capsule (30 mg total) by mouth 2 (two) times daily for 4 days.   rosuvastatin 20 MG tablet Commonly known as: CRESTOR Take 1 tablet (20 mg total) by mouth daily. Start taking on: November 20, 2020 What changed:  medication strength how much to take when to take this       No Known Allergies  Follow-up Information     [facility]  Follow up.   Specialty: Cardiology Why: Cardiology Hospital Follow-up on 12/04/2020 at 10:00 AM with Dr. 12/06/2020. Contact information: [address] [place] [phone]        [place], [phone], PA-C. Call in 1 day(s).   Specialty: Physician Assistant Why: Please call for a posthospital follow-up appointment. Contact information: [address] [place] [phone]                  The results of  significant diagnostics from this hospitalization (including imaging, microbiology, ancillary and laboratory) are listed below for reference.    Significant Diagnostic Studies: DG Chest Port 1 View  Result Date: 11/17/2020 CLINICAL DATA:  Cough.  Fever and weakness for 2 days. EXAM: PORTABLE CHEST 1 VIEW COMPARISON:  None. FINDINGS: Cardiac silhouette is mildly enlarged. No mediastinal or hilar masses. Dense atherosclerotic calcification courses along the thoracic aorta. Mild linear opacities in the lung bases consistent with atelectasis or scarring. Remainder of the lungs is clear. No convincing pleural effusion and no pneumothorax. Skeletal structures are demineralized. Prominent dextroscoliosis of the thoracic spine. IMPRESSION: No acute cardiopulmonary disease. Electronically Signed   By: Amie Portland M.D.   On: 11/17/2020 19:44   ECHOCARDIOGRAM COMPLETE  Result  Date: 11/18/2020    ECHOCARDIOGRAM REPORT   Patient Name:   CORDELL COKE Date of Exam: 11/18/2020 Medical Rec #:  [id number]  Height:       63.0 in Accession #:    [id number] Weight:       132.9 lb Date of Birth:  [birth year]  BSA:          1.626 m Patient Age:    80 years   BP:           136/81 mmHg Patient Gender: F          HR:           86 bpm. [facility] Procedure: 2D Echo, Cardiac Doppler and Color Doppler Indications:    Elevated Troponin  History:        Patient has no prior history of Echocardiogram examinations.                 Arrythmias:Tachycardia, Signs/Symptoms:Elevated troponin, Type A                 influenza and Murmur; Risk Factors:Diabetes, Hypertension and                 Dyslipidemia.  Sonographer:    Lavenia Atlas RDCS Referring Phys: [id number]  N  IMPRESSIONS  1. The aortic valve is severely calcified with restricted leaflet motion in systole. Visually, there is concern for severe aortic stenosis however the measurements are inconsistent with this (Vmax 2.3 m/s, MG 12 mmHG). Suspect the values are underestimated due to poor alighnment of the CW probe. If there are clinical concerns for severe aortic stenosis (loud murmur) would recommend a TEE for clarification. The aortic valve is tricuspid. There is severe calcifcation of the aortic valve. There  is severe thickening of the aortic valve. Aortic valve regurgitation is not visualized. Moderate to severe aortic valve stenosis.  2. Hyperdynamic LV with intracavitary gradient ~14 mmHG. Left ventricular ejection fraction, by estimation, is >75%. The left ventricle has hyperdynamic function. The left ventricle has no regional wall motion abnormalities. Left ventricular diastolic function could not be evaluated.  3. Right ventricular systolic function is normal. The right ventricular size is normal. There is normal pulmonary artery systolic pressure. The estimated right ventricular systolic pressure is 32.4 mmHg.  4. Left  atrial size was mildly dilated.  5. The mitral valve is degenerative. Mild to moderate mitral valve regurgitation. Mild to moderate mitral stenosis. The mean mitral valve gradient is 5.5 mmHg with average heart rate of 91 bpm. Severe mitral annular calcification.  6. The inferior vena cava is normal in size with greater than 50% respiratory variability, suggesting right atrial pressure of 3 mmHg. FINDINGS  Left Ventricle: Hyperdynamic LV  with intracavitary gradient ~14 mmHG. Left ventricular ejection fraction, by estimation, is >75%. The left ventricle has hyperdynamic function. The left ventricle has no regional wall motion abnormalities. The left ventricular internal cavity size was small. There is no left ventricular hypertrophy. Left ventricular diastolic function could not be evaluated due to mitral annular calcification (moderate or greater). Left ventricular diastolic function could not be evaluated. Right Ventricle: The right ventricular size is normal. No increase in right ventricular wall thickness. Right ventricular systolic function is normal. There is normal pulmonary artery systolic pressure. The tricuspid regurgitant velocity is 2.71 m/s, and  with an assumed right atrial pressure of 3 mmHg, the estimated right ventricular systolic pressure is 32.4 mmHg. Left Atrium: Left atrial size was mildly dilated. Right Atrium: Right atrial size was normal in size. Pericardium: Trivial pericardial effusion is present. Mitral Valve: The mitral valve is degenerative in appearance. Severe mitral annular calcification. Mild to moderate mitral valve regurgitation. Mild to moderate mitral valve stenosis. MV peak gradient, 14.7 mmHg. The mean mitral valve gradient is 5.5 mmHg with average heart rate of 91 bpm. Tricuspid Valve: The tricuspid valve is grossly normal. Tricuspid valve regurgitation is mild . No evidence of tricuspid stenosis. Aortic Valve: The aortic valve is severely calcified with restricted leaflet  motion in systole. Visually, there is concern for severe aortic stenosis however the measurements are inconsistent with this (Vmax 2.3 m/s, MG 12 mmHG). Suspect the values are underestimated due to poor alighnment of the CW probe. If there are clinical concerns for severe aortic stenosis (loud murmur) would recommend a TEE for clarification. The aortic valve is tricuspid. There is severe calcifcation of the aortic valve. There  is severe thickening of the aortic valve. Aortic valve regurgitation is not visualized. Moderate to severe aortic stenosis is present. Aortic valve mean gradient measures 12.0 mmHg. Aortic valve peak gradient measures 21.6 mmHg. Aortic valve area, by VTI measures 1.51 cm. Pulmonic Valve: The pulmonic valve was grossly normal. Pulmonic valve regurgitation is not visualized. No evidence of pulmonic stenosis. Aorta: The aortic root and ascending aorta are structurally normal, with no evidence of dilitation. Venous: The inferior vena cava is normal in size with greater than 50% respiratory variability, suggesting right atrial pressure of 3 mmHg. IAS/Shunts: The atrial septum is grossly normal.  LEFT VENTRICLE PLAX 2D LVIDd:         4.40 cm   Diastology LVIDs:         2.40 cm   LV e' medial:    3.37 cm/s LV PW:         1.00 cm   LV E/e' medial:  28.9 LV IVS:        1.00 cm   LV e' lateral:   5.11 cm/s LVOT diam:     1.90 cm   LV E/e' lateral: 19.0 LV SV:         71 LV SV Index:   44 LVOT Area:     2.84 cm  RIGHT VENTRICLE RV Basal diam:  3.30 cm RV S prime:     13.80 cm/s TAPSE (M-mode): 2.0 cm LEFT ATRIUM             Index        RIGHT ATRIUM          Index LA diam:        4.00 cm 2.46 cm/m   RA Area:     7.56 cm LA Vol (A2C):   45.1 ml 27.75 ml/m  RA Volume:   12.00 ml 7.38 ml/m LA Vol (A4C):   60.5 ml 37.22 ml/m LA Biplane Vol: 56.5 ml 34.76 ml/m  AORTIC VALVE AV Area (Vmax):    1.43 cm AV Area (Vmean):   1.49 cm AV Area (VTI):     1.51 cm AV Vmax:           232.50 cm/s AV Vmean:           161.000 cm/s AV VTI:            0.472 m AV Peak Grad:      21.6 mmHg AV Mean Grad:      12.0 mmHg LVOT Vmax:         117.00 cm/s LVOT Vmean:        84.700 cm/s LVOT VTI:          0.251 m LVOT/AV VTI ratio: 0.53  AORTA Ao Root diam: 2.50 cm MITRAL VALVE                TRICUSPID VALVE MV Area (PHT): 4.93 cm     TR Peak grad:   29.4 mmHg MV Area VTI:   1.83 cm     TR Vmax:        271.00 cm/s MV Peak grad:  14.7 mmHg MV Mean grad:  5.5 mmHg     SHUNTS MV Vmax:       1.92 m/s     Systemic VTI:  0.25 m MV Vmean:      110.5 cm/s   Systemic Diam: 1.90 cm MV Decel Time: 154 msec MV E velocity: 97.30 cm/s MV A velocity: 158.00 cm/s MV E/A ratio:  0.62 Lennie Odor MD Electronically signed by Lennie Odor MD Signature Date/Time: 11/18/2020/4:22:21 PM    Final     Microbiology: Recent Results (from the past 240 hour(s))  Culture, blood (routine x 2)     Status: None (Preliminary result)   Collection Time: 11/17/20  7:19 PM   Specimen: BLOOD  Result Value Ref Range Status   Specimen Description BLOOD LEFT ANTECUBITAL  Final   Special Requests   Final    BOTTLES DRAWN AEROBIC AND ANAEROBIC Blood Culture results may not be optimal due to an excessive volume of blood received in culture bottles   Culture   Final    NO GROWTH 2 DAYS Performed at [facility], [address] [place], [place] [address]    Report Status PENDING  Incomplete  Culture, blood (routine x 2)     Status: None (Preliminary result)   Collection Time: 11/17/20  7:22 PM   Specimen: BLOOD  Result Value Ref Range Status   Specimen Description BLOOD BLOOD LEFT FOREARM  Final   Special Requests   Final    BOTTLES DRAWN AEROBIC AND ANAEROBIC Blood Culture adequate volume   Culture   Final    NO GROWTH 2 DAYS Performed at [facility], [address] [place], [place] [address]    Report Status PENDING  Incomplete  Resp Panel by RT-PCR (Flu A&B, Covid) Nasopharyngeal Swab     Status: Abnormal   Collection Time: 11/17/20  7:26 PM    Specimen: Nasopharyngeal Swab; Nasopharyngeal(NP) swabs in vial transport medium  Result Value Ref Range Status   SARS Coronavirus 2 by RT PCR NEGATIVE NEGATIVE Final    Comment: (NOTE) SARS-CoV-2 target nucleic acids are NOT DETECTED.  The SARS-CoV-2 RNA is generally detectable in upper respiratory specimens during the acute phase of infection. The  lowest concentration of SARS-CoV-2 viral copies this assay can detect is 138 copies/mL. A negative result does not preclude SARS-Cov-2 infection and should not be used as the sole basis for treatment or other patient management decisions. A negative result may occur with  improper specimen collection/handling, submission of specimen other than nasopharyngeal swab, presence of viral mutation(s) within the areas targeted by this assay, and inadequate number of viral copies(<138 copies/mL). A negative result must be combined with clinical observations, patient history, and epidemiological information. The expected result is Negative.  Fact Sheet for Patients:  BloggerCourse.com  Fact Sheet for Healthcare Providers:  SeriousBroker.it  This test is no t yet approved or cleared by the [place] FDA and  has been authorized for detection and/or diagnosis of SARS-CoV-2 by FDA under an Emergency Use Authorization (EUA). This EUA will remain  in effect (meaning this test can be used) for the duration of the COVID-19 declaration under Section 564(b)(1) of the Act, 21 U.S.C.section 360bbb-3(b)(1), unless the authorization is terminated  or revoked sooner.       Influenza A by PCR POSITIVE (A) NEGATIVE Final   Influenza B by PCR NEGATIVE NEGATIVE Final    Comment: (NOTE) The Xpert Xpress SARS-CoV-2/FLU/RSV plus assay is intended as an aid in the diagnosis of influenza from Nasopharyngeal swab specimens and should not be used as a sole basis for treatment. Nasal washings and aspirates are  unacceptable for Xpert Xpress SARS-CoV-2/FLU/RSV testing.  Fact Sheet for Patients: BloggerCourse.com  Fact Sheet for Healthcare Providers: SeriousBroker.it  This test is not yet approved or cleared by the [place] FDA and has been authorized for detection and/or diagnosis of SARS-CoV-2 by FDA under an Emergency Use Authorization (EUA). This EUA will remain in effect (meaning this test can be used) for the duration of the COVID-19 declaration under Section 564(b)(1) of the Act, 21 U.S.C. section 360bbb-3(b)(1), unless the authorization is terminated or revoked.  Performed at [facility], [address] [place], [place] [address]      Labs: Basic Metabolic Panel: Recent Labs  Lab 11/17/20 1919 11/18/20 0800  NA 132* 136  K 4.2 4.2  CL 98 101  CO2 26 29  GLUCOSE 161* 111*  BUN 17 15  CREATININE 0.98 0.92  CALCIUM 9.4 8.9  MG  --  2.2  PHOS  --  3.2   Liver Function Tests: Recent Labs  Lab 11/17/20 1919  AST 32  ALT 19  ALKPHOS 61  BILITOT 0.5  PROT 7.6  ALBUMIN 3.9   No results for input(s): LIPASE, AMYLASE in the last 168 hours. No results for input(s): AMMONIA in the last 168 hours. CBC: Recent Labs  Lab 11/17/20 1919 11/18/20 0836  WBC 7.3 5.5  NEUTROABS 5.4  --   HGB 12.1 12.0  HCT 36.9 36.6  MCV 80.4 83.2  PLT 210 206   Cardiac Enzymes: No results for input(s): CKTOTAL, CKMB, CKMBINDEX, TROPONINI in the last 168 hours. BNP: BNP (last 3 results) No results for input(s): BNP in the last 8760 hours.  ProBNP (last 3 results) No results for input(s): PROBNP in the last 8760 hours.  CBG: Recent Labs  Lab 11/18/20 1114 11/18/20 1709 11/18/20 2107 11/19/20 0739 11/19/20 1154  GLUCAP 124* 109* 98 105* 179*       Signed:  Darlin Drop, MD [facility] 11/19/2020, 1:00 PM

## 2020-11-23 LAB — CULTURE, BLOOD (ROUTINE X 2)
Culture: NO GROWTH
Culture: NO GROWTH
Special Requests: ADEQUATE

## 2020-12-03 NOTE — H&P (View-Only) (Signed)
Cardiology Office Note:    Date:  12/04/2020   ID:  Kelly Clarke, DOB 04-02-1940, MRN [id number]  PCP:  Fredirick Lathe, PA-C  Cardiologist:  None  Electrophysiologist:  None   Referring MD: Allwardt, Randa Evens, PA-C   Chief Complaint  Patient presents with   Cardiac Valve Problem    History of Present Illness:    Kelly Clarke is a 80 y.o. female with a hx of T2DM, hypertension, hyperlipidemia, aortic stenosis who is referred by Theresa Duty, PA for evaluation of aortic stenosis.  She was admitted to [facility] on 11/17/2020 with fever and cough.  Echocardiogram was done given troponin greater than 600.  Echocardiogram on 11/18/2020 showed hyperdynamic LV function (EF greater than 75%), intracavitary gradient 14 mmHg, normal RV function, mild to moderate MR, mild to moderate MS, severe MAC, severely calcified aortic valve with moderate to severe AS (mean gradient only 12 mmHg, V-max 2.3 m/s).  She tested positive for flu.  Since discharge from the hospital, she reports she is doing better.  Dyspnea has resolved.  Does report she has been having chest pain with activity.  Notices it when she does yard work.  Describes as congestion in center of chest.  Resolves with rest.  She denies any lightheadedness, syncope, lower extremity edema, or palpitations.  No smoking history.  No known history of heart disease in her immediate family.   Past Medical History:  Diagnosis Date   Diabetes mellitus without complication ([place])    History of kidney stones    Hypertension     Past Surgical History:  Procedure Laterality Date   CATARACT EXTRACTION W/PHACO Right 02/26/2018   Procedure: CATARACT EXTRACTION PHACO AND INTRAOCULAR LENS PLACEMENT RIGHT EYE (CDE: 9.94);  Surgeon: Baruch Goldmann, MD;  Location: [facility];  Service: Ophthalmology;  Laterality: Right;   CATARACT EXTRACTION W/PHACO Left 03/12/2018   Procedure: CATARACT EXTRACTION PHACO AND INTRAOCULAR LENS PLACEMENT (IOC);  Surgeon: Baruch Goldmann, MD;  Location: [facility];  Service: Ophthalmology;  Laterality: Left;  CDE: 11.25    Current Medications: Current Meds  Medication Sig   amLODipine (NORVASC) 10 MG tablet Take 10 mg by mouth daily.   losartan (COZAAR) 100 MG tablet Take 100 mg by mouth daily.   metFORMIN (GLUCOPHAGE-XR) 500 MG 24 hr tablet Take 500 mg by mouth daily.   Multiple Vitamin (MULTIVITAMIN WITH MINERALS) TABS tablet Take 1 tablet by mouth daily.   rosuvastatin (CRESTOR) 20 MG tablet Take 1 tablet (20 mg total) by mouth daily.     Allergies:   Patient has no known allergies.   Social History   Socioeconomic History   Marital status: Widowed    Spouse name: Not on file   Number of children: Not on file   Years of education: Not on file   Highest education level: Not on file  Occupational History   Not on file  Tobacco Use   Smoking status: Never   Smokeless tobacco: Never  Vaping Use   Vaping Use: Never used  Substance and Sexual Activity   Alcohol use: Never   Drug use: Never   Sexual activity: Not Currently    Birth control/protection: Post-menopausal  Other Topics Concern   Not on file  Social History Narrative   Not on file   Social Determinants of Health   Financial Resource Strain: Not on file  Food Insecurity: Not on file  Transportation Needs: Not on file  Physical Activity: Not on file  Stress: Not on  file  Social Connections: Not on file     Family History: No known history of heart disease in her immediate family.  ROS:   Please see the history of present illness.     All other systems reviewed and are negative.  EKGs/Labs/Other Studies Reviewed:    The following studies were reviewed today:   EKG:  EKG is not ordered today.  The ekg ordered 11/17/2020 shows sinus tachycardia, rate 101, no ST abnormality  Recent Labs: 11/17/2020: ALT 19 11/18/2020: Magnesium 2.2 12/04/2020: BUN 15; Creatinine, Ser 0.76; Hemoglobin 11.8; Platelets 281; Potassium 4.0; Sodium 138   Recent Lipid Panel    Component Value Date/Time   CHOL 213 (H) 11/19/2020 0529   TRIG 153 (H) 11/19/2020 0529   HDL 38 (L) 11/19/2020 0529   CHOLHDL 5.6 11/19/2020 0529   VLDL 31 11/19/2020 0529   LDLCALC 144 (H) 11/19/2020 0529    Physical Exam:    VS:  BP 136/74   Pulse 100   Ht _0  (1.549 m)   Wt 133 lb 9.6 oz (60.6 kg)   SpO2 96%   BMI 25.24 kg/m     Wt Readings from Last 3 Encounters:  12/04/20 133 lb 9.6 oz (60.6 kg)  11/17/20 132 lb 15 oz (60.3 kg)  03/12/18 135 lb (61.2 kg)     GEN:  Well nourished, well developed in no acute distress HEENT: Normal NECK: No JVD; No carotid bruits LYMPHATICS: No lymphadenopathy CARDIAC: RRR, 3/6 systolic murmur RESPIRATORY:  Clear to auscultation without rales, wheezing or rhonchi  ABDOMEN: Soft, non-tender, non-distended MUSCULOSKELETAL:  No edema; No deformity  SKIN: Warm and dry NEUROLOGIC:  Alert and oriented x 3 PSYCHIATRIC:  Normal affect   ASSESSMENT:    1. Chest pain of uncertain etiology   2. Aortic valve stenosis, etiology of cardiac valve disease unspecified   3. Essential hypertension   4. Hyperlipidemia, unspecified hyperlipidemia type    PLAN:    Aortic stenosis: Echocardiogram on 11/18/2020 showed hyperdynamic LV function (EF greater than 75%), intracavitary gradient 14 mmHg, normal RV function, mild to moderate MR, mild to moderate MS, severe MAC, severely calcified aortic valve with moderate to severe AS  -  Moderate to severe AS on echo (appeared severe visually but mean gradient only only 12 mm, V-max 2.3 m/s, suspected poor alignment of Doppler signal).  She is reporting symptoms consistent with typical angina.  Could be due to AS if severe, will also need to rule out obstructive CAD.  Recommend LHC/RHC both to exclude obstructive CAD and further evaluate severity of aortic stenosis.  -Risks and benefits of cardiac catheterization have been discussed with the patient.  These include bleeding, infection,  kidney damage, stroke, heart attack, death.  The patient understands these risks and is willing to proceed.  Hypertension: On amlodipine 10 mg daily and losartan 100 mg daily.  Appears controlled  Hyperlipidemia: LDL 144 11/19/2020, Crestor dose increased to 20 mg daily  T2DM: On metformin   RTC in 1 month   Medication Adjustments/Labs and Tests Ordered: Current medicines are reviewed at length with the patient today.  Concerns regarding medicines are outlined above.  Orders Placed This Encounter  Procedures   Basic metabolic panel   CBC   No orders of the defined types were placed in this encounter.   Patient Instructions  Medication Instructions:  Your physician recommends that you continue on your current medications as directed. Please refer to the Current Medication list given to you today.  *  If you need a refill on your cardiac medications before your next appointment, please call your pharmacy*   Lab Work: Your physician recommends that you return for lab work in: Today ( CBC, BMET)   If you have labs (blood work) drawn today and your tests are completely normal, you will receive your results only by: MyChart Message (if you have [place]) OR A paper copy in the mail If you have any lab test that is abnormal or we need to change your treatment, we will call you to review the results.   Testing/Procedures: Your physician has requested that you have a cardiac catheterization. Cardiac catheterization is used to diagnose and/or treat various heart conditions. Doctors may recommend this procedure for a number of different reasons. The most common reason is to evaluate chest pain. Chest pain can be a symptom of coronary artery disease (CAD), and cardiac catheterization can show whether plaque is narrowing or blocking your heart's arteries. This procedure is also used to evaluate the valves, as well as measure the blood flow and oxygen levels in different parts of your heart. For  further information please visit [email or web address]. Please follow instruction sheet, as given.    Follow-Up: At [facility], you and your health needs are our priority.  As part of our continuing mission to provide you with exceptional heart care, we have created designated Provider Care Teams.  These Care Teams include your primary Cardiologist (physician) and Advanced Practice Providers (APPs -  Physician Assistants and Nurse Practitioners) who all work together to provide you with the care you need, when you need it.  We recommend signing up for the patient portal called "MyChart".  Sign up information is provided on this After Visit Summary.  MyChart is used to connect with patients for Virtual Visits (Telemedicine).  Patients are able to view lab/test results, encounter notes, upcoming appointments, etc.  Non-urgent messages can be sent to your provider as well.   To learn more about what you can do with MyChart, go to [email or web address].    Your next appointment:    January 6   The format for your next appointment:   In Person  Provider:   Dr Gardiner Rhyme      Other Instructions Thank you for choosing [facility]!    [place] [address] Dept: [phone] Loc: [phone]  Ailen Strauch  12/04/2020  You are scheduled for a Cardiac Catheterization on Thursday, December 1 with Dr. Lauree Chandler.  1. Please arrive at the [facility] (Main Entrance A) at [facility]: [address] [place], [place] [address] at 7:00 AM (This time is two hours before your procedure to ensure your preparation). Free valet parking service is available.   Special note: Every effort is made to have your procedure done on time. Please understand that emergencies sometimes delay scheduled procedures.  2. Diet: Do not eat solid foods after midnight.  The patient may have  clear liquids until 5am upon the day of the procedure.  3. Labs: You will need to have blood drawn on , December 1 at [place] [address], [place]  Open: 7am - 6pm, Sat 8am - 12 noon   Phone: [phone]. You do not need to be fasting.  4. Medication instructions in preparation for your procedure:   Contrast Allergy: No  Do not take Diabetes Med Glucophage (Metformin) on the day of the procedure  and HOLD 48 HOURS AFTER THE PROCEDURE.  On the morning of your procedure, take your Aspirin and any morning medicines NOT listed above.  You may use sips of water.  5. Plan for one night stay--bring personal belongings. 6. Bring a current list of your medications and current insurance cards. 7. You MUST have a responsible person to drive you home. 8. Someone MUST be with you the first 24 hours after you arrive home or your discharge will be delayed. 9. Please wear clothes that are easy to get on and off and wear slip-on shoes.  Thank you for allowing [place] to care for you!   -- [facility] Invasive Cardiovascular services    Signed, Donato Heinz, MD  12/04/2020 10:52 PM    [place]

## 2020-12-03 NOTE — Progress Notes (Signed)
Cardiology Office Note:    Date:  12/04/2020   ID:  Jenita Canada, DOB 08/10/1940, MRN [id number]  PCP:  Allwardt, Alyssa M, PA-C  Cardiologist:  None  Electrophysiologist:  None   Referring MD: Allwardt, Alyssa M, PA-C   Chief Complaint  Patient presents with   Cardiac Valve Problem    History of Present Illness:    Kelly Clarke is a 80 y.o. female with a hx of T2DM, hypertension, hyperlipidemia, aortic stenosis who is referred by Alyssa Allwardt, PA for evaluation of aortic stenosis.  She was admitted to [facility] on 11/17/2020 with fever and cough.  Echocardiogram was done given troponin greater than 600.  Echocardiogram on 11/18/2020 showed hyperdynamic LV function (EF greater than 75%), intracavitary gradient 14 mmHg, normal RV function, mild to moderate MR, mild to moderate MS, severe MAC, severely calcified aortic valve with moderate to severe AS (mean gradient only 12 mmHg, V-max 2.3 m/s).  She tested positive for flu.  Since discharge from the hospital, she reports she is doing better.  Dyspnea has resolved.  Does report she has been having chest pain with activity.  Notices it when she does yard work.  Describes as congestion in center of chest.  Resolves with rest.  She denies any lightheadedness, syncope, lower extremity edema, or palpitations.  No smoking history.  No known history of heart disease in her immediate family.   Past Medical History:  Diagnosis Date   Diabetes mellitus without complication (HCC)    History of kidney stones    Hypertension     Past Surgical History:  Procedure Laterality Date   CATARACT EXTRACTION W/PHACO Right 02/26/2018   Procedure: CATARACT EXTRACTION PHACO AND INTRAOCULAR LENS PLACEMENT RIGHT EYE (CDE: 9.94);  Surgeon: Wrzosek, James, MD;  Location: [facility];  Service: Ophthalmology;  Laterality: Right;   CATARACT EXTRACTION W/PHACO Left 03/12/2018   Procedure: CATARACT EXTRACTION PHACO AND INTRAOCULAR LENS PLACEMENT (IOC);  Surgeon: Wrzosek,  James, MD;  Location: [facility];  Service: Ophthalmology;  Laterality: Left;  CDE: 11.25    Current Medications: Current Meds  Medication Sig   amLODipine (NORVASC) 10 MG tablet Take 10 mg by mouth daily.   losartan (COZAAR) 100 MG tablet Take 100 mg by mouth daily.   metFORMIN (GLUCOPHAGE-XR) 500 MG 24 hr tablet Take 500 mg by mouth daily.   Multiple Vitamin (MULTIVITAMIN WITH MINERALS) TABS tablet Take 1 tablet by mouth daily.   rosuvastatin (CRESTOR) 20 MG tablet Take 1 tablet (20 mg total) by mouth daily.     Allergies:   Patient has no known allergies.   Social History   Socioeconomic History   Marital status: Widowed    Spouse name: Not on file   Number of children: Not on file   Years of education: Not on file   Highest education level: Not on file  Occupational History   Not on file  Tobacco Use   Smoking status: Never   Smokeless tobacco: Never  Vaping Use   Vaping Use: Never used  Substance and Sexual Activity   Alcohol use: Never   Drug use: Never   Sexual activity: Not Currently    Birth control/protection: Post-menopausal  Other Topics Concern   Not on file  Social History Narrative   Not on file   Social Determinants of Health   Financial Resource Strain: Not on file  Food Insecurity: Not on file  Transportation Needs: Not on file  Physical Activity: Not on file  Stress: Not on   file  Social Connections: Not on file     Family History: No known history of heart disease in her immediate family.  ROS:   Please see the history of present illness.     All other systems reviewed and are negative.  EKGs/Labs/Other Studies Reviewed:    The following studies were reviewed today:   EKG:  EKG is not ordered today.  The ekg ordered 11/17/2020 shows sinus tachycardia, rate 101, no ST abnormality  Recent Labs: 11/17/2020: ALT 19 11/18/2020: Magnesium 2.2 12/04/2020: BUN 15; Creatinine, Ser 0.76; Hemoglobin 11.8; Platelets 281; Potassium 4.0; Sodium 138   Recent Lipid Panel    Component Value Date/Time   CHOL 213 (H) 11/19/2020 0529   TRIG 153 (H) 11/19/2020 0529   HDL 38 (L) 11/19/2020 0529   CHOLHDL 5.6 11/19/2020 0529   VLDL 31 11/19/2020 0529   LDLCALC 144 (H) 11/19/2020 0529    Physical Exam:    VS:  BP 136/74   Pulse 100   Ht _0  (1.549 m)   Wt 133 lb 9.6 oz (60.6 kg)   SpO2 96%   BMI 25.24 kg/m     Wt Readings from Last 3 Encounters:  12/04/20 133 lb 9.6 oz (60.6 kg)  11/17/20 132 lb 15 oz (60.3 kg)  03/12/18 135 lb (61.2 kg)     GEN:  Well nourished, well developed in no acute distress HEENT: Normal NECK: No JVD; No carotid bruits LYMPHATICS: No lymphadenopathy CARDIAC: RRR, 3/6 systolic murmur RESPIRATORY:  Clear to auscultation without rales, wheezing or rhonchi  ABDOMEN: Soft, non-tender, non-distended MUSCULOSKELETAL:  No edema; No deformity  SKIN: Warm and dry NEUROLOGIC:  Alert and oriented x 3 PSYCHIATRIC:  Normal affect   ASSESSMENT:    1. Chest pain of uncertain etiology   2. Aortic valve stenosis, etiology of cardiac valve disease unspecified   3. Essential hypertension   4. Hyperlipidemia, unspecified hyperlipidemia type    PLAN:    Aortic stenosis: Echocardiogram on 11/18/2020 showed hyperdynamic LV function (EF greater than 75%), intracavitary gradient 14 mmHg, normal RV function, mild to moderate MR, mild to moderate MS, severe MAC, severely calcified aortic valve with moderate to severe AS  -  Moderate to severe AS on echo (appeared severe visually but mean gradient only only 12 mm, V-max 2.3 m/s, suspected poor alignment of Doppler signal).  She is reporting symptoms consistent with typical angina.  Could be due to AS if severe, will also need to rule out obstructive CAD.  Recommend LHC/RHC both to exclude obstructive CAD and further evaluate severity of aortic stenosis.  -Risks and benefits of cardiac catheterization have been discussed with the patient.  These include bleeding, infection,  kidney damage, stroke, heart attack, death.  The patient understands these risks and is willing to proceed.  Hypertension: On amlodipine 10 mg daily and losartan 100 mg daily.  Appears controlled  Hyperlipidemia: LDL 144 11/19/2020, Crestor dose increased to 20 mg daily  T2DM: On metformin   RTC in 1 month   Medication Adjustments/Labs and Tests Ordered: Current medicines are reviewed at length with the patient today.  Concerns regarding medicines are outlined above.  Orders Placed This Encounter  Procedures   Basic metabolic panel   CBC   No orders of the defined types were placed in this encounter.   Patient Instructions  Medication Instructions:  Your physician recommends that you continue on your current medications as directed. Please refer to the Current Medication list given to you today.  *  If you need a refill on your cardiac medications before your next appointment, please call your pharmacy*   Lab Work: Your physician recommends that you return for lab work in: Today ( CBC, BMET)   If you have labs (blood work) drawn today and your tests are completely normal, you will receive your results only by: MyChart Message (if you have [place] [address]) OR A paper copy in the mail If you have any lab test that is abnormal or we need to change your treatment, we will call you to review the results.   Testing/Procedures: Your physician has requested that you have a cardiac catheterization. Cardiac catheterization is used to diagnose and/or treat various heart conditions. Doctors may recommend this procedure for a number of different reasons. The most common reason is to evaluate chest pain. Chest pain can be a symptom of coronary artery disease (CAD), and cardiac catheterization can show whether plaque is narrowing or blocking your heart's arteries. This procedure is also used to evaluate the valves, as well as measure the blood flow and oxygen levels in different parts of your heart. For  further information please visit [email or web address]. Please follow instruction sheet, as given.    Follow-Up: At [facility], you and your health needs are our priority.  As part of our continuing mission to provide you with exceptional heart care, we have created designated Provider Care Teams.  These Care Teams include your primary Cardiologist (physician) and Advanced Practice Providers (APPs -  Physician Assistants and Nurse Practitioners) who all work together to provide you with the care you need, when you need it.  We recommend signing up for the patient portal called "MyChart".  Sign up information is provided on this After Visit Summary.  MyChart is used to connect with patients for Virtual Visits (Telemedicine).  Patients are able to view lab/test results, encounter notes, upcoming appointments, etc.  Non-urgent messages can be sent to your provider as well.   To learn more about what you can do with MyChart, go to [email or web address].    Your next appointment:    January 6   The format for your next appointment:   In Person  Provider:   Dr Gardiner Rhyme      Other Instructions Thank you for choosing [facility]!    [place] [address] Dept: [phone] Loc: [phone]  Taronda Comacho  12/04/2020  You are scheduled for a Cardiac Catheterization on Thursday, December 1 with Dr. Lauree Chandler.  1. Please arrive at the [facility] (Main Entrance A) at [facility]: [address] [place], [place] [address] at 7:00 AM (This time is two hours before your procedure to ensure your preparation). Free valet parking service is available.   Special note: Every effort is made to have your procedure done on time. Please understand that emergencies sometimes delay scheduled procedures.  2. Diet: Do not eat solid foods after midnight.  The patient may have  clear liquids until 5am upon the day of the procedure.  3. Labs: You will need to have blood drawn on , December 1 at [place] [address], [place]  Open: 7am - 6pm, Sat 8am - 12 noon   Phone: [phone]. You do not need to be fasting.  4. Medication instructions in preparation for your procedure:   Contrast Allergy: No  Do not take Diabetes Med Glucophage (Metformin) on the day of the procedure  and HOLD 48 HOURS AFTER THE PROCEDURE.  On the morning of your procedure, take your Aspirin and any morning medicines NOT listed above.  You may use sips of water.  5. Plan for one night stay--bring personal belongings. 6. Bring a current list of your medications and current insurance cards. 7. You MUST have a responsible person to drive you home. 8. Someone MUST be with you the first 24 hours after you arrive home or your discharge will be delayed. 9. Please wear clothes that are easy to get on and off and wear slip-on shoes.  Thank you for allowing [place] to care for you!   -- [facility] Invasive Cardiovascular services    Signed, Donato Heinz, MD  12/04/2020 10:52 PM    [place]

## 2020-12-03 NOTE — H&P (View-Only) (Signed)
Cardiology Office Note:    Date:  12/04/2020   ID:  Sheilah Pigeon, DOB [birth year], MRN [id number]  PCP:  Fredirick Lathe, PA-C  Cardiologist:  None  Electrophysiologist:  None   Referring MD: Allwardt, Randa Evens, PA-C   Chief Complaint  Patient presents with   Cardiac Valve Problem    History of Present Illness:    Kelly Clarke is a 80 y.o. female with a hx of T2DM, hypertension, hyperlipidemia, aortic stenosis who is referred by Theresa Duty, PA for evaluation of aortic stenosis.  She was admitted to [facility] on 11/17/2020 with fever and cough.  Echocardiogram was done given troponin greater than 600.  Echocardiogram on 11/18/2020 showed hyperdynamic LV function (EF greater than 75%), intracavitary gradient 14 mmHg, normal RV function, mild to moderate MR, mild to moderate MS, severe MAC, severely calcified aortic valve with moderate to severe AS (mean gradient only 12 mmHg, V-max 2.3 m/s).  She tested positive for flu.  Since discharge from the hospital, she reports she is doing better.  Dyspnea has resolved.  Does report she has been having chest pain with activity.  Notices it when she does yard work.  Describes as congestion in center of chest.  Resolves with rest.  She denies any lightheadedness, syncope, lower extremity edema, or palpitations.  No smoking history.  No known history of heart disease in her immediate family.   Past Medical History:  Diagnosis Date   Diabetes mellitus without complication ([place])    History of kidney stones    Hypertension     Past Surgical History:  Procedure Laterality Date   CATARACT EXTRACTION W/PHACO Right 02/26/2018   Procedure: CATARACT EXTRACTION PHACO AND INTRAOCULAR LENS PLACEMENT RIGHT EYE (CDE: 9.94);  Surgeon: Baruch Goldmann, MD;  Location: [facility];  Service: Ophthalmology;  Laterality: Right;   CATARACT EXTRACTION W/PHACO Left 03/12/2018   Procedure: CATARACT EXTRACTION PHACO AND INTRAOCULAR LENS PLACEMENT (IOC);  Surgeon: Baruch Goldmann, MD;  Location: [facility];  Service: Ophthalmology;  Laterality: Left;  CDE: 11.25    Current Medications: Current Meds  Medication Sig   amLODipine (NORVASC) 10 MG tablet Take 10 mg by mouth daily.   losartan (COZAAR) 100 MG tablet Take 100 mg by mouth daily.   metFORMIN (GLUCOPHAGE-XR) 500 MG 24 hr tablet Take 500 mg by mouth daily.   Multiple Vitamin (MULTIVITAMIN WITH MINERALS) TABS tablet Take 1 tablet by mouth daily.   rosuvastatin (CRESTOR) 20 MG tablet Take 1 tablet (20 mg total) by mouth daily.     Allergies:   Patient has no known allergies.   Social History   Socioeconomic History   Marital status: Widowed    Spouse name: Not on file   Number of children: Not on file   Years of education: Not on file   Highest education level: Not on file  Occupational History   Not on file  Tobacco Use   Smoking status: Never   Smokeless tobacco: Never  Vaping Use   Vaping Use: Never used  Substance and Sexual Activity   Alcohol use: Never   Drug use: Never   Sexual activity: Not Currently    Birth control/protection: Post-menopausal  Other Topics Concern   Not on file  Social History Narrative   Not on file   Social Determinants of Health   Financial Resource Strain: Not on file  Food Insecurity: Not on file  Transportation Needs: Not on file  Physical Activity: Not on file  Stress: Not on  file  Social Connections: Not on file     Family History: No known history of heart disease in her immediate family.  ROS:   Please see the history of present illness.     All other systems reviewed and are negative.  EKGs/Labs/Other Studies Reviewed:    The following studies were reviewed today:   EKG:  EKG is not ordered today.  The ekg ordered 11/17/2020 shows sinus tachycardia, rate 101, no ST abnormality  Recent Labs: 11/17/2020: ALT 19 11/18/2020: Magnesium 2.2 12/04/2020: BUN 15; Creatinine, Ser 0.76; Hemoglobin 11.8; Platelets 281; Potassium 4.0; Sodium 138   Recent Lipid Panel    Component Value Date/Time   CHOL 213 (H) 11/19/2020 0529   TRIG 153 (H) 11/19/2020 0529   HDL 38 (L) 11/19/2020 0529   CHOLHDL 5.6 11/19/2020 0529   VLDL 31 11/19/2020 0529   LDLCALC 144 (H) 11/19/2020 0529    Physical Exam:    VS:  BP 136/74   Pulse 100   Ht _0  (1.549 m)   Wt 133 lb 9.6 oz (60.6 kg)   SpO2 96%   BMI 25.24 kg/m     Wt Readings from Last 3 Encounters:  12/04/20 133 lb 9.6 oz (60.6 kg)  11/17/20 132 lb 15 oz (60.3 kg)  03/12/18 135 lb (61.2 kg)     GEN:  Well nourished, well developed in no acute distress HEENT: Normal NECK: No JVD; No carotid bruits LYMPHATICS: No lymphadenopathy CARDIAC: RRR, 3/6 systolic murmur RESPIRATORY:  Clear to auscultation without rales, wheezing or rhonchi  ABDOMEN: Soft, non-tender, non-distended MUSCULOSKELETAL:  No edema; No deformity  SKIN: Warm and dry NEUROLOGIC:  Alert and oriented x 3 PSYCHIATRIC:  Normal affect   ASSESSMENT:    1. Chest pain of uncertain etiology   2. Aortic valve stenosis, etiology of cardiac valve disease unspecified   3. Essential hypertension   4. Hyperlipidemia, unspecified hyperlipidemia type    PLAN:    Aortic stenosis: Echocardiogram on 11/18/2020 showed hyperdynamic LV function (EF greater than 75%), intracavitary gradient 14 mmHg, normal RV function, mild to moderate MR, mild to moderate MS, severe MAC, severely calcified aortic valve with moderate to severe AS  -  Moderate to severe AS on echo (appeared severe visually but mean gradient only only 12 mm, V-max 2.3 m/s, suspected poor alignment of Doppler signal).  She is reporting symptoms consistent with typical angina.  Could be due to AS if severe, will also need to rule out obstructive CAD.  Recommend LHC/RHC both to exclude obstructive CAD and further evaluate severity of aortic stenosis.  -Risks and benefits of cardiac catheterization have been discussed with the patient.  These include bleeding, infection,  kidney damage, stroke, heart attack, death.  The patient understands these risks and is willing to proceed.  Hypertension: On amlodipine 10 mg daily and losartan 100 mg daily.  Appears controlled  Hyperlipidemia: LDL 144 11/19/2020, Crestor dose increased to 20 mg daily  T2DM: On metformin   RTC in 1 month   Medication Adjustments/Labs and Tests Ordered: Current medicines are reviewed at length with the patient today.  Concerns regarding medicines are outlined above.  Orders Placed This Encounter  Procedures   Basic metabolic panel   CBC   No orders of the defined types were placed in this encounter.   Patient Instructions  Medication Instructions:  Your physician recommends that you continue on your current medications as directed. Please refer to the Current Medication list given to you today.  *  If you need a refill on your cardiac medications before your next appointment, please call your pharmacy*   Lab Work: Your physician recommends that you return for lab work in: Today ( CBC, BMET)   If you have labs (blood work) drawn today and your tests are completely normal, you will receive your results only by: MyChart Message (if you have [place]) OR A paper copy in the mail If you have any lab test that is abnormal or we need to change your treatment, we will call you to review the results.   Testing/Procedures: Your physician has requested that you have a cardiac catheterization. Cardiac catheterization is used to diagnose and/or treat various heart conditions. Doctors may recommend this procedure for a number of different reasons. The most common reason is to evaluate chest pain. Chest pain can be a symptom of coronary artery disease (CAD), and cardiac catheterization can show whether plaque is narrowing or blocking your heart's arteries. This procedure is also used to evaluate the valves, as well as measure the blood flow and oxygen levels in different parts of your heart. For  further information please visit [email or web address]. Please follow instruction sheet, as given.    Follow-Up: At [facility], you and your health needs are our priority.  As part of our continuing mission to provide you with exceptional heart care, we have created designated Provider Care Teams.  These Care Teams include your primary Cardiologist (physician) and Advanced Practice Providers (APPs -  Physician Assistants and Nurse Practitioners) who all work together to provide you with the care you need, when you need it.  We recommend signing up for the patient portal called "MyChart".  Sign up information is provided on this After Visit Summary.  MyChart is used to connect with patients for Virtual Visits (Telemedicine).  Patients are able to view lab/test results, encounter notes, upcoming appointments, etc.  Non-urgent messages can be sent to your provider as well.   To learn more about what you can do with MyChart, go to [email or web address].    Your next appointment:    January 6   The format for your next appointment:   In Person  Provider:   Dr Gardiner Rhyme      Other Instructions Thank you for choosing [place]!    [place] [address] Dept: [phone] Loc: [phone]  Kelly Clarke  12/04/2020  You are scheduled for a Cardiac Catheterization on Thursday, December 1 with Dr. Lauree Chandler.  1. Please arrive at the [facility] (Main Entrance A) at [facility]: [address] [place], [place] [address] at 7:00 AM (This time is two hours before your procedure to ensure your preparation). Free valet parking service is available.   Special note: Every effort is made to have your procedure done on time. Please understand that emergencies sometimes delay scheduled procedures.  2. Diet: Do not eat solid foods after midnight.  The patient may have  clear liquids until 5am upon the day of the procedure.  3. Labs: You will need to have blood drawn on , December 1 at [address], [place]  Open: 7am - 6pm, Sat 8am - 12 noon   Phone: [phone]. You do not need to be fasting.  4. Medication instructions in preparation for your procedure:   Contrast Allergy: No  Do not take Diabetes Med Glucophage (Metformin) on the day of the procedure  and HOLD 48 HOURS AFTER THE PROCEDURE.  On the morning of your procedure, take your Aspirin and any morning medicines NOT listed above.  You may use sips of water.  5. Plan for one night stay--bring personal belongings. 6. Bring a current list of your medications and current insurance cards. 7. You MUST have a responsible person to drive you home. 8. Someone MUST be with you the first 24 hours after you arrive home or your discharge will be delayed. 9. Please wear clothes that are easy to get on and off and wear slip-on shoes.  Thank you for allowing [place] to care for you!   -- [facility] Invasive Cardiovascular services    Signed, Donato Heinz, MD  12/04/2020 10:52 PM    [place]

## 2020-12-04 ENCOUNTER — Other Ambulatory Visit (HOSPITAL_COMMUNITY)
Admission: RE | Admit: 2020-12-04 | Discharge: 2020-12-04 | Disposition: A | Discharge status: Home / Self Care | Payer: Medicare HMO | Source: Ambulatory Visit | Attending: Cardiology | Admitting: Cardiology

## 2020-12-04 ENCOUNTER — Encounter: Payer: Self-pay | Admitting: Cardiology

## 2020-12-04 ENCOUNTER — Other Ambulatory Visit: Payer: Self-pay

## 2020-12-04 ENCOUNTER — Ambulatory Visit: Payer: Medicare HMO | Admitting: Cardiology

## 2020-12-04 VITALS — BP 136/74 | HR 100 | Ht 61.0 in | Wt 133.6 lb

## 2020-12-04 DIAGNOSIS — E785 Hyperlipidemia, unspecified: Secondary | ICD-10-CM

## 2020-12-04 DIAGNOSIS — R079 Chest pain, unspecified: Secondary | ICD-10-CM | POA: Insufficient documentation

## 2020-12-04 DIAGNOSIS — Z01818 Encounter for other preprocedural examination: Secondary | ICD-10-CM | POA: Diagnosis not present

## 2020-12-04 DIAGNOSIS — I350 Nonrheumatic aortic (valve) stenosis: Secondary | ICD-10-CM | POA: Diagnosis not present

## 2020-12-04 DIAGNOSIS — I10 Essential (primary) hypertension: Secondary | ICD-10-CM

## 2020-12-04 LAB — CBC
HCT: 35.4 % — ABNORMAL LOW (ref 36.0–46.0)
Hemoglobin: 11.8 g/dL — ABNORMAL LOW (ref 12.0–15.0)
MCH: 27.1 pg (ref 26.0–34.0)
MCHC: 33.3 g/dL (ref 30.0–36.0)
MCV: 81.4 fL (ref 80.0–100.0)
Platelets: 281 10*3/uL (ref 150–400)
RBC: 4.35 MIL/uL (ref 3.87–5.11)
RDW: 12.5 % (ref 11.5–15.5)
WBC: 7 10*3/uL (ref 4.0–10.5)
nRBC: 0 % (ref 0.0–0.2)

## 2020-12-04 LAB — BASIC METABOLIC PANEL
Anion gap: 9 (ref 5–15)
BUN: 15 mg/dL (ref 8–23)
CO2: 28 mmol/L (ref 22–32)
Calcium: 9.9 mg/dL (ref 8.9–10.3)
Chloride: 101 mmol/L (ref 98–111)
Creatinine, Ser: 0.76 mg/dL (ref 0.44–1.00)
GFR, Estimated: >60 mL/min (ref >60)
Glucose, Bld: 125 mg/dL — ABNORMAL HIGH (ref 70–99)
Potassium: 4 mmol/L (ref 3.5–5.1)
Sodium: 138 mmol/L (ref 135–145)

## 2020-12-04 NOTE — Patient Instructions (Signed)
Medication Instructions:  Your physician recommends that you continue on your current medications as directed. Please refer to the Current Medication list given to you today.  *If you need a refill on your cardiac medications before your next appointment, please call your pharmacy*   Lab Work: Your physician recommends that you return for lab work in: Today ( CBC, BMET)   If you have labs (blood work) drawn today and your tests are completely normal, you will receive your results only by: MyChart Message (if you have MyChart) OR A paper copy in the mail If you have any lab test that is abnormal or we need to change your treatment, we will call you to review the results.   Testing/Procedures: Your physician has requested that you have a cardiac catheterization. Cardiac catheterization is used to diagnose and/or treat various heart conditions. Doctors may recommend this procedure for a number of different reasons. The most common reason is to evaluate chest pain. Chest pain can be a symptom of coronary artery disease (CAD), and cardiac catheterization can show whether plaque is narrowing or blocking your heart's arteries. This procedure is also used to evaluate the valves, as well as measure the blood flow and oxygen levels in different parts of your heart. For further information please visit [email or web address] Please follow instruction sheet, as given.    Follow-Up: At [facility], you and your health needs are our priority.  As part of our continuing mission to provide you with exceptional heart care, we have created designated Provider Care Teams.  These Care Teams include your primary Cardiologist (physician) and Advanced Practice Providers (APPs -  Physician Assistants and Nurse Practitioners) who all work together to provide you with the care you need, when you need it.  We recommend signing up for the patient portal called "MyChart".  Sign up information is provided on this After  Visit Summary.  MyChart is used to connect with patients for Virtual Visits (Telemedicine).  Patients are able to view lab/test results, encounter notes, upcoming appointments, etc.  Non-urgent messages can be sent to your provider as well.   To learn more about what you can do with MyChart, go to [email or web address].    Your next appointment:    January 6   The format for your next appointment:   In Person  Provider:   Dr Bjorn Pippin      Other Instructions Thank you for choosing [facility]!    [facility] [place] [address] [place] [address] Dept: [phone] Loc: [phone]  Flannery Cavallero  12/04/2020  You are scheduled for a Cardiac Catheterization on Thursday, December 1 with Dr. Verne Carrow.  1. Please arrive at the [facility] (Main Entrance A) at [facility]: [address] [place], [place] [address] at 7:00 AM (This time is two hours before your procedure to ensure your preparation). Free valet parking service is available.   Special note: Every effort is made to have your procedure done on time. Please understand that emergencies sometimes delay scheduled procedures.  2. Diet: Do not eat solid foods after midnight.  The patient may have clear liquids until 5am upon the day of the procedure.  3. Labs: You will need to have blood drawn on , December 1 at [facility] [address], [place]  Open: 7am - 6pm, Sat 8am - 12 noon   Phone: [phone]. You do not need to be fasting.  4.  Medication instructions in preparation for your procedure:   Contrast Allergy: No  Do not take Diabetes Med Glucophage (Metformin) on the day of the procedure and HOLD 48 HOURS AFTER THE PROCEDURE.  On the morning of your procedure, take your Aspirin and any morning medicines NOT listed above.  You may use sips of water.  5. Plan for one night stay--bring personal  belongings. 6. Bring a current list of your medications and current insurance cards. 7. You MUST have a responsible person to drive you home. 8. Someone MUST be with you the first 24 hours after you arrive home or your discharge will be delayed. 9. Please wear clothes that are easy to get on and off and wear slip-on shoes.  Thank you for allowing [place] to care for you!   -- [place] Invasive Cardiovascular services

## 2020-12-07 DIAGNOSIS — E1169 Type 2 diabetes mellitus with other specified complication: Secondary | ICD-10-CM | POA: Diagnosis not present

## 2020-12-07 DIAGNOSIS — J111 Influenza due to unidentified influenza virus with other respiratory manifestations: Secondary | ICD-10-CM | POA: Diagnosis not present

## 2020-12-07 DIAGNOSIS — Z6826 Body mass index (BMI) 26.0-26.9, adult: Secondary | ICD-10-CM | POA: Diagnosis not present

## 2020-12-07 DIAGNOSIS — R011 Cardiac murmur, unspecified: Secondary | ICD-10-CM | POA: Diagnosis not present

## 2020-12-07 DIAGNOSIS — I10 Essential (primary) hypertension: Secondary | ICD-10-CM | POA: Diagnosis not present

## 2020-12-07 DIAGNOSIS — E7801 Familial hypercholesterolemia: Secondary | ICD-10-CM | POA: Diagnosis not present

## 2020-12-15 ENCOUNTER — Encounter: Payer: Self-pay | Admitting: *Deleted

## 2020-12-16 ENCOUNTER — Telehealth: Payer: Self-pay | Admitting: *Deleted

## 2020-12-16 NOTE — Telephone Encounter (Signed)
Cardiac catheterization scheduled at [facility] for: Thursday December 17, 2020 9 AM Arrive [facility] Main Entrance A [facility]) at: 7 AM   No solid food after midnight prior to cath, clear liquids until 5 AM day of procedure.  Medication instructions: Hold: Metformin-day of procedure and 48 hours post procedure  Except hold medications usual morning medications can be taken pre-cath with sips of water including aspirin 81 mg.    Confirmed patient has responsible adult to drive home post procedure and be with patient first 24 hours after arriving home.  [facility] does allow one visitor to accompany you and wait in the hospital waiting room while you are there for your procedure. You and your visitor will be asked to wear a mask once you enter the hospital.   Patient reports does not currently have any new symptoms concerning for COVID-19 and no household members with COVID-19 like illness.     Reviewed procedure/mask/visitor instructions with patient.

## 2020-12-17 ENCOUNTER — Other Ambulatory Visit: Payer: Self-pay

## 2020-12-17 ENCOUNTER — Encounter (HOSPITAL_COMMUNITY)
Admission: RE | Disposition: A | Discharge status: Home / Self Care | Payer: Self-pay | Source: Home / Self Care | Attending: Cardiovascular Disease

## 2020-12-17 ENCOUNTER — Other Ambulatory Visit: Payer: Self-pay | Admitting: Physician Assistant

## 2020-12-17 ENCOUNTER — Encounter (HOSPITAL_COMMUNITY): Payer: Self-pay | Admitting: Cardiovascular Disease

## 2020-12-17 ENCOUNTER — Ambulatory Visit (HOSPITAL_COMMUNITY)
Admission: RE | Admit: 2020-12-17 | Discharge: 2020-12-17 | Disposition: A | Discharge status: Home / Self Care | Payer: Medicare HMO | Attending: Cardiovascular Disease | Admitting: Cardiovascular Disease

## 2020-12-17 DIAGNOSIS — I10 Essential (primary) hypertension: Secondary | ICD-10-CM | POA: Diagnosis not present

## 2020-12-17 DIAGNOSIS — I252 Old myocardial infarction: Secondary | ICD-10-CM | POA: Insufficient documentation

## 2020-12-17 DIAGNOSIS — I25110 Atherosclerotic heart disease of native coronary artery with unstable angina pectoris: Secondary | ICD-10-CM

## 2020-12-17 DIAGNOSIS — E119 Type 2 diabetes mellitus without complications: Secondary | ICD-10-CM | POA: Diagnosis not present

## 2020-12-17 DIAGNOSIS — R079 Chest pain, unspecified: Secondary | ICD-10-CM

## 2020-12-17 DIAGNOSIS — I350 Nonrheumatic aortic (valve) stenosis: Secondary | ICD-10-CM

## 2020-12-17 DIAGNOSIS — I2510 Atherosclerotic heart disease of native coronary artery without angina pectoris: Secondary | ICD-10-CM

## 2020-12-17 HISTORY — PX: RIGHT/LEFT HEART CATH AND CORONARY ANGIOGRAPHY: CATH118266

## 2020-12-17 LAB — POCT I-STAT 7, (LYTES, BLD GAS, ICA,H+H)
Acid-Base Excess: 1 mmol/L (ref 0.0–2.0)
Bicarbonate: 26.5 mmol/L (ref 20.0–28.0)
Calcium, Ion: 1.23 mmol/L (ref 1.15–1.40)
HCT: 33 % — ABNORMAL LOW (ref 36.0–46.0)
Hemoglobin: 11.2 g/dL — ABNORMAL LOW (ref 12.0–15.0)
O2 Saturation: 97 %
Potassium: 3.8 mmol/L (ref 3.5–5.1)
Sodium: 143 mmol/L (ref 135–145)
TCO2: 28 mmol/L (ref 22–32)
pCO2 arterial: 46.7 mmHg (ref 32.0–48.0)
pH, Arterial: 7.363 (ref 7.350–7.450)
pO2, Arterial: 98 mmHg (ref 83.0–108.0)

## 2020-12-17 LAB — POCT I-STAT EG7
Acid-Base Excess: 2 mmol/L (ref 0.0–2.0)
Bicarbonate: 28.5 mmol/L — ABNORMAL HIGH (ref 20.0–28.0)
Calcium, Ion: 1.27 mmol/L (ref 1.15–1.40)
HCT: 35 % — ABNORMAL LOW (ref 36.0–46.0)
Hemoglobin: 11.9 g/dL — ABNORMAL LOW (ref 12.0–15.0)
O2 Saturation: 74 %
Potassium: 3.8 mmol/L (ref 3.5–5.1)
Sodium: 143 mmol/L (ref 135–145)
TCO2: 30 mmol/L (ref 22–32)
pCO2, Ven: 53.4 mmHg (ref 44.0–60.0)
pH, Ven: 7.335 (ref 7.250–7.430)
pO2, Ven: 43 mmHg (ref 32.0–45.0)

## 2020-12-17 LAB — GLUCOSE, CAPILLARY
Glucose-Capillary: 129 mg/dL — ABNORMAL HIGH (ref 70–99)
Glucose-Capillary: 110 mg/dL — ABNORMAL HIGH (ref 70–99)

## 2020-12-17 SURGERY — RIGHT/LEFT HEART CATH AND CORONARY ANGIOGRAPHY
Anesthesia: LOCAL

## 2020-12-17 MED ORDER — SODIUM CHLORIDE 0.9 % WEIGHT BASED INFUSION
3.0000 mL/kg/h | INTRAVENOUS | Status: AC
  Administered 2020-12-17: 3 mL/kg/h via INTRAVENOUS

## 2020-12-17 MED ORDER — HEPARIN SODIUM (PORCINE) 1000 UNIT/ML IJ SOLN
INTRAMUSCULAR | Status: DC | PRN
  Administered 2020-12-17: 2000 [IU] via INTRAVENOUS

## 2020-12-17 MED ORDER — MIDAZOLAM HCL 2 MG/2ML IJ SOLN
INTRAMUSCULAR | Status: AC
  Filled 2020-12-17: qty 2

## 2020-12-17 MED ORDER — SODIUM CHLORIDE 0.9% FLUSH: 3.0000 mL | Freq: Two times a day (BID) | INTRAVENOUS | Status: DC

## 2020-12-17 MED ORDER — LIDOCAINE HCL (PF) 1 % IJ SOLN
INTRAMUSCULAR | Status: DC | PRN
  Administered 2020-12-17 (×2): 2 mL
  Administered 2020-12-17: 10 mL

## 2020-12-17 MED ORDER — ACETAMINOPHEN 325 MG PO TABS: 650.0000 mg | ORAL_TABLET | ORAL | Status: DC | PRN

## 2020-12-17 MED ORDER — VERAPAMIL HCL 2.5 MG/ML IV SOLN
INTRAVENOUS | Status: AC
  Filled 2020-12-17: qty 2

## 2020-12-17 MED ORDER — ONDANSETRON HCL 4 MG/2ML IJ SOLN: 4.0000 mg | Freq: Four times a day (QID) | INTRAMUSCULAR | Status: DC | PRN

## 2020-12-17 MED ORDER — ASPIRIN 81 MG PO CHEW: 81.0000 mg | CHEWABLE_TABLET | ORAL | Status: DC

## 2020-12-17 MED ORDER — SODIUM CHLORIDE 0.9 % IV SOLN: 250.0000 mL | INTRAVENOUS | Status: DC | PRN

## 2020-12-17 MED ORDER — SODIUM CHLORIDE 0.9% FLUSH: 3.0000 mL | INTRAVENOUS | Status: DC | PRN

## 2020-12-17 MED ORDER — HEPARIN (PORCINE) IN NACL 1000-0.9 UT/500ML-% IV SOLN
INTRAVENOUS | Status: DC | PRN
  Administered 2020-12-17 (×2): 500 mL

## 2020-12-17 MED ORDER — FENTANYL CITRATE (PF) 100 MCG/2ML IJ SOLN
INTRAMUSCULAR | Status: AC
  Filled 2020-12-17: qty 2

## 2020-12-17 MED ORDER — CLOPIDOGREL BISULFATE 75 MG PO TABS: 75.0000 mg | ORAL_TABLET | Freq: Every day | ORAL | 3 refills | Status: DC

## 2020-12-17 MED ORDER — SODIUM CHLORIDE 0.9 % IV SOLN: INTRAVENOUS | Status: AC

## 2020-12-17 MED ORDER — SODIUM CHLORIDE 0.9 % WEIGHT BASED INFUSION: 1.0000 mL/kg/h | INTRAVENOUS | Status: DC

## 2020-12-17 MED ORDER — VERAPAMIL HCL 2.5 MG/ML IV SOLN
INTRAVENOUS | Status: DC | PRN
  Administered 2020-12-17: 10 mL via INTRA_ARTERIAL

## 2020-12-17 MED ORDER — IOHEXOL 350 MG/ML SOLN
INTRAVENOUS | Status: DC | PRN
  Administered 2020-12-17: 55 mL

## 2020-12-17 MED ORDER — MIDAZOLAM HCL 2 MG/2ML IJ SOLN
INTRAMUSCULAR | Status: DC | PRN
  Administered 2020-12-17: 1 mg via INTRAVENOUS

## 2020-12-17 MED ORDER — CLOPIDOGREL BISULFATE 75 MG PO TABS
300.0000 mg | ORAL_TABLET | Freq: Once | ORAL | Status: AC
  Administered 2020-12-17: 300 mg via ORAL
  Filled 2020-12-17: qty 4

## 2020-12-17 MED ORDER — HEPARIN SODIUM (PORCINE) 1000 UNIT/ML IJ SOLN
INTRAMUSCULAR | Status: AC
  Filled 2020-12-17: qty 10

## 2020-12-17 MED ORDER — LIDOCAINE HCL (PF) 1 % IJ SOLN
INTRAMUSCULAR | Status: AC
  Filled 2020-12-17: qty 30

## 2020-12-17 MED ORDER — HYDRALAZINE HCL 20 MG/ML IJ SOLN: 10.0000 mg | INTRAMUSCULAR | Status: DC | PRN

## 2020-12-17 MED ORDER — HEPARIN (PORCINE) IN NACL 1000-0.9 UT/500ML-% IV SOLN
INTRAVENOUS | Status: AC
  Filled 2020-12-17: qty 1000

## 2020-12-17 MED ORDER — FENTANYL CITRATE (PF) 100 MCG/2ML IJ SOLN
INTRAMUSCULAR | Status: DC | PRN
  Administered 2020-12-17: 25 ug via INTRAVENOUS

## 2020-12-17 MED ORDER — LABETALOL HCL 5 MG/ML IV SOLN: 10.0000 mg | INTRAVENOUS | Status: DC | PRN

## 2020-12-17 SURGICAL SUPPLY — 17 items
CATH 5FR JL3.5 JR4 ANG PIG MP (CATHETERS) ×2 IMPLANT
CATH BALLN WEDGE 5F 110CM (CATHETERS) ×2 IMPLANT
CLOSURE MYNX CONTROL 5F (Vascular Products) ×2 IMPLANT
DEVICE RAD COMP TR BAND LRG (VASCULAR PRODUCTS) ×2 IMPLANT
GLIDESHEATH SLEND SS 6F .021 (SHEATH) ×2 IMPLANT
GUIDEWIRE INQWIRE 1.5J.035X260 (WIRE) ×1 IMPLANT
INQWIRE 1.5J .035X260CM (WIRE) ×2
KIT HEART LEFT (KITS) ×2 IMPLANT
KIT MICROPUNCTURE NIT STIFF (SHEATH) ×2 IMPLANT
PACK CARDIAC CATHETERIZATION (CUSTOM PROCEDURE TRAY) ×2 IMPLANT
SHEATH GLIDE SLENDER 4/5FR (SHEATH) ×2 IMPLANT
SHEATH PINNACLE 5F 10CM (SHEATH) ×2 IMPLANT
SHEATH PROBE COVER 6X72 (BAG) ×2 IMPLANT
TRANSDUCER W/STOPCOCK (MISCELLANEOUS) ×2 IMPLANT
TUBING CIL FLEX 10 FLL-RA (TUBING) ×2 IMPLANT
WIRE EMERALD 3MM-J .035X150CM (WIRE) ×2 IMPLANT
WIRE HI TORQ VERSACORE-J 145CM (WIRE) ×2 IMPLANT

## 2020-12-17 NOTE — Discharge Instructions (Addendum)
Instructions for PCI on Thursday 12/24/20 -Nothing to eat or drink after midnight on Wednesday night -Arrive at Short stay 12/24/20 @ 0630

## 2020-12-17 NOTE — Interval H&P Note (Signed)
History and Physical Interval Note:  12/17/2020 7:41 AM  Kelly Clarke  has presented today for surgery, with the diagnosis of aortic stenosis, chest pain.  The various methods of treatment have been discussed with the patient and family. After consideration of risks, benefits and other options for treatment, the patient has consented to  Procedure(s): RIGHT/LEFT HEART CATH AND CORONARY ANGIOGRAPHY (N/A) as a surgical intervention.  The patient's history has been reviewed, patient examined, no change in status, stable for surgery.  I have reviewed the patient's chart and labs.  Questions were answered to the patient's satisfaction.    Cath Lab Visit (complete for each Cath Lab visit)  Clinical Evaluation Leading to the Procedure:   ACS: No.  Non-ACS:    Anginal Classification: CCS III  Anti-ischemic medical therapy: Minimal Therapy (1 class of medications)  Non-Invasive Test Results: No non-invasive testing performed  Prior CABG: No previous CABG        Kelly Clarke

## 2020-12-19 DIAGNOSIS — I10 Essential (primary) hypertension: Secondary | ICD-10-CM | POA: Diagnosis not present

## 2020-12-19 DIAGNOSIS — E7800 Pure hypercholesterolemia, unspecified: Secondary | ICD-10-CM | POA: Diagnosis not present

## 2020-12-19 DIAGNOSIS — E1169 Type 2 diabetes mellitus with other specified complication: Secondary | ICD-10-CM | POA: Diagnosis not present

## 2020-12-19 DIAGNOSIS — J111 Influenza due to unidentified influenza virus with other respiratory manifestations: Secondary | ICD-10-CM | POA: Diagnosis not present

## 2020-12-19 DIAGNOSIS — Z6826 Body mass index (BMI) 26.0-26.9, adult: Secondary | ICD-10-CM | POA: Diagnosis not present

## 2020-12-23 ENCOUNTER — Telehealth: Payer: Self-pay | Admitting: *Deleted

## 2020-12-23 NOTE — Telephone Encounter (Signed)
Coronary atherectomy scheduled at [facility] for: Thursday December 24, 2020 9 AM Arrive [facility] Main Entrance A [facility]) at: 6:30 AM-needs BMP/CBC   Diet-no solid food after midnight prior to cath, clear liquids until 5 AM day of procedure  Medication instructions for procedure: -Hold:   Metformin-day of procedure and 48 hours post procedure -Except hold medications usual morning medications can be taken pre-cath with sips of water including aspirin 81 mg and Plavix 75 mg.    Confirmed patient has responsible adult to drive home post procedure and be with patient first 24 hours after arriving home.  [facility] does allow one visitor to accompany you and wait in the hospital waiting room while you are there for your procedure. You and your visitor will be asked to wear a mask once you enter the hospital.   Patient reports does not currently have any new symptoms concerning for COVID-19 and no household members with COVID-19 like illness.    Reviewed procedure/mask/visitor instructions with patient.

## 2020-12-24 ENCOUNTER — Ambulatory Visit (HOSPITAL_COMMUNITY)
Admission: RE | Admit: 2020-12-24 | Discharge: 2020-12-25 | Disposition: A | Discharge status: Home / Self Care | Payer: Medicare HMO | Attending: Cardiovascular Disease | Admitting: Cardiovascular Disease

## 2020-12-24 ENCOUNTER — Encounter (HOSPITAL_COMMUNITY)
Admission: RE | Disposition: A | Discharge status: Home / Self Care | Payer: Self-pay | Source: Home / Self Care | Attending: Cardiovascular Disease

## 2020-12-24 ENCOUNTER — Other Ambulatory Visit: Payer: Self-pay

## 2020-12-24 DIAGNOSIS — E785 Hyperlipidemia, unspecified: Secondary | ICD-10-CM | POA: Diagnosis not present

## 2020-12-24 DIAGNOSIS — E119 Type 2 diabetes mellitus without complications: Secondary | ICD-10-CM | POA: Diagnosis not present

## 2020-12-24 DIAGNOSIS — I10 Essential (primary) hypertension: Secondary | ICD-10-CM | POA: Diagnosis present

## 2020-12-24 DIAGNOSIS — I350 Nonrheumatic aortic (valve) stenosis: Secondary | ICD-10-CM | POA: Insufficient documentation

## 2020-12-24 DIAGNOSIS — Z79899 Other long term (current) drug therapy: Secondary | ICD-10-CM | POA: Diagnosis not present

## 2020-12-24 DIAGNOSIS — Z955 Presence of coronary angioplasty implant and graft: Secondary | ICD-10-CM | POA: Diagnosis not present

## 2020-12-24 DIAGNOSIS — I25110 Atherosclerotic heart disease of native coronary artery with unstable angina pectoris: Secondary | ICD-10-CM

## 2020-12-24 DIAGNOSIS — Z7984 Long term (current) use of oral hypoglycemic drugs: Secondary | ICD-10-CM | POA: Insufficient documentation

## 2020-12-24 DIAGNOSIS — I200 Unstable angina: Secondary | ICD-10-CM

## 2020-12-24 HISTORY — PX: CORONARY ATHERECTOMY: CATH118238

## 2020-12-24 HISTORY — PX: CORONARY STENT INTERVENTION: CATH118234

## 2020-12-24 LAB — POCT ACTIVATED CLOTTING TIME
Activated Clotting Time: 323 seconds
Activated Clotting Time: 257 seconds
Activated Clotting Time: 197 seconds
Activated Clotting Time: 173 seconds

## 2020-12-24 LAB — BASIC METABOLIC PANEL
Anion gap: 8 (ref 5–15)
BUN: 17 mg/dL (ref 8–23)
CO2: 29 mmol/L (ref 22–32)
Calcium: 10 mg/dL (ref 8.9–10.3)
Chloride: 100 mmol/L (ref 98–111)
Creatinine, Ser: 0.87 mg/dL (ref 0.44–1.00)
GFR, Estimated: >60 mL/min (ref >60)
Glucose, Bld: 154 mg/dL — ABNORMAL HIGH (ref 70–99)
Potassium: 4.2 mmol/L (ref 3.5–5.1)
Sodium: 137 mmol/L (ref 135–145)

## 2020-12-24 LAB — CBC
HCT: 37.9 % (ref 36.0–46.0)
Hemoglobin: 12.4 g/dL (ref 12.0–15.0)
MCH: 26.4 pg (ref 26.0–34.0)
MCHC: 32.7 g/dL (ref 30.0–36.0)
MCV: 80.8 fL (ref 80.0–100.0)
Platelets: 264 10*3/uL (ref 150–400)
RBC: 4.69 MIL/uL (ref 3.87–5.11)
RDW: 12.5 % (ref 11.5–15.5)
WBC: 8.5 10*3/uL (ref 4.0–10.5)
nRBC: 0 % (ref 0.0–0.2)

## 2020-12-24 LAB — GLUCOSE, CAPILLARY
Glucose-Capillary: 144 mg/dL — ABNORMAL HIGH (ref 70–99)
Glucose-Capillary: 133 mg/dL — ABNORMAL HIGH (ref 70–99)
Glucose-Capillary: 202 mg/dL — ABNORMAL HIGH (ref 70–99)
Glucose-Capillary: 131 mg/dL — ABNORMAL HIGH (ref 70–99)

## 2020-12-24 SURGERY — CORONARY STENT INTERVENTION
Anesthesia: LOCAL

## 2020-12-24 MED ORDER — HEPARIN (PORCINE) IN NACL 1000-0.9 UT/500ML-% IV SOLN: INTRAVENOUS | Status: DC | PRN

## 2020-12-24 MED ORDER — LOSARTAN POTASSIUM 50 MG PO TABS
100.0000 mg | ORAL_TABLET | Freq: Every day | ORAL | Status: DC
  Administered 2020-12-25: 100 mg via ORAL
  Filled 2020-12-24: qty 2

## 2020-12-24 MED ORDER — HYDRALAZINE HCL 20 MG/ML IJ SOLN: 10.0000 mg | INTRAMUSCULAR | Status: AC | PRN

## 2020-12-24 MED ORDER — LIDOCAINE HCL (PF) 1 % IJ SOLN
INTRAMUSCULAR | Status: AC
  Filled 2020-12-24: qty 30

## 2020-12-24 MED ORDER — HEPARIN SODIUM (PORCINE) 1000 UNIT/ML IJ SOLN
INTRAMUSCULAR | Status: DC | PRN
  Administered 2020-12-24: 8000 [IU] via INTRAVENOUS

## 2020-12-24 MED ORDER — LABETALOL HCL 5 MG/ML IV SOLN: 10.0000 mg | INTRAVENOUS | Status: AC | PRN

## 2020-12-24 MED ORDER — FENTANYL CITRATE (PF) 100 MCG/2ML IJ SOLN
INTRAMUSCULAR | Status: DC | PRN
  Administered 2020-12-24: 25 ug via INTRAVENOUS

## 2020-12-24 MED ORDER — SODIUM CHLORIDE 0.9% FLUSH: 3.0000 mL | INTRAVENOUS | Status: DC | PRN

## 2020-12-24 MED ORDER — SODIUM CHLORIDE 0.9% FLUSH
3.0000 mL | Freq: Two times a day (BID) | INTRAVENOUS | Status: DC
  Administered 2020-12-24 – 2020-12-25 (×2): 3 mL via INTRAVENOUS

## 2020-12-24 MED ORDER — SODIUM CHLORIDE 0.9 % WEIGHT BASED INFUSION: 1.0000 mL/kg/h | INTRAVENOUS | Status: DC

## 2020-12-24 MED ORDER — FENTANYL CITRATE (PF) 100 MCG/2ML IJ SOLN
INTRAMUSCULAR | Status: AC
  Filled 2020-12-24: qty 2

## 2020-12-24 MED ORDER — ATROPINE SULFATE 1 MG/10ML IJ SOSY
0.5000 mg | PREFILLED_SYRINGE | Freq: Once | INTRAMUSCULAR | Status: AC
  Administered 2020-12-24: 0.5 mg via INTRAVENOUS

## 2020-12-24 MED ORDER — MIDAZOLAM HCL 2 MG/2ML IJ SOLN
INTRAMUSCULAR | Status: DC | PRN
  Administered 2020-12-24: 1 mg via INTRAVENOUS

## 2020-12-24 MED ORDER — NITROGLYCERIN 1 MG/10 ML FOR IR/CATH LAB
INTRA_ARTERIAL | Status: AC
  Filled 2020-12-24: qty 10

## 2020-12-24 MED ORDER — HEPARIN (PORCINE) IN NACL 1000-0.9 UT/500ML-% IV SOLN
INTRAVENOUS | Status: AC
  Filled 2020-12-24: qty 1000

## 2020-12-24 MED ORDER — AMLODIPINE BESYLATE 10 MG PO TABS
10.0000 mg | ORAL_TABLET | Freq: Every day | ORAL | Status: DC
  Administered 2020-12-25: 10 mg via ORAL
  Filled 2020-12-24: qty 1

## 2020-12-24 MED ORDER — CLOPIDOGREL BISULFATE 75 MG PO TABS: 75.0000 mg | ORAL_TABLET | ORAL | Status: DC

## 2020-12-24 MED ORDER — ROSUVASTATIN CALCIUM 20 MG PO TABS
20.0000 mg | ORAL_TABLET | Freq: Every day | ORAL | Status: DC
  Administered 2020-12-24 – 2020-12-25 (×2): 20 mg via ORAL
  Filled 2020-12-24 (×2): qty 1

## 2020-12-24 MED ORDER — SODIUM CHLORIDE 0.9 % IV SOLN: INTRAVENOUS | Status: AC

## 2020-12-24 MED ORDER — MIDAZOLAM HCL 2 MG/2ML IJ SOLN
INTRAMUSCULAR | Status: AC
  Filled 2020-12-24: qty 2

## 2020-12-24 MED ORDER — ATROPINE SULFATE 1 MG/10ML IJ SOSY
PREFILLED_SYRINGE | INTRAMUSCULAR | Status: AC
  Filled 2020-12-24: qty 10

## 2020-12-24 MED ORDER — CLOPIDOGREL BISULFATE 75 MG PO TABS
75.0000 mg | ORAL_TABLET | Freq: Every day | ORAL | Status: DC
  Administered 2020-12-25: 75 mg via ORAL
  Filled 2020-12-24: qty 1

## 2020-12-24 MED ORDER — HEPARIN (PORCINE) IN NACL 2000-0.9 UNIT/L-% IV SOLN
INTRAVENOUS | Status: DC | PRN
  Administered 2020-12-24: 1000 mL

## 2020-12-24 MED ORDER — ASPIRIN 81 MG PO CHEW
81.0000 mg | CHEWABLE_TABLET | Freq: Every day | ORAL | Status: DC
  Administered 2020-12-25: 81 mg via ORAL
  Filled 2020-12-24: qty 1

## 2020-12-24 MED ORDER — SODIUM CHLORIDE 0.9 % IV SOLN: 250.0000 mL | INTRAVENOUS | Status: DC | PRN

## 2020-12-24 MED ORDER — HEPARIN SODIUM (PORCINE) 1000 UNIT/ML IJ SOLN
INTRAMUSCULAR | Status: AC
  Filled 2020-12-24: qty 10

## 2020-12-24 MED ORDER — LIDOCAINE HCL (PF) 1 % IJ SOLN
INTRAMUSCULAR | Status: DC | PRN
  Administered 2020-12-24: 10 mL

## 2020-12-24 MED ORDER — SODIUM CHLORIDE 0.9 % WEIGHT BASED INFUSION
3.0000 mL/kg/h | INTRAVENOUS | Status: DC
  Administered 2020-12-24: 3 mL/kg/h via INTRAVENOUS

## 2020-12-24 MED ORDER — ACETAMINOPHEN 325 MG PO TABS: 650.0000 mg | ORAL_TABLET | ORAL | Status: DC | PRN

## 2020-12-24 MED ORDER — VIPERSLIDE LUBRICANT OPTIME
TOPICAL | Status: DC | PRN
  Administered 2020-12-24: 20 mL via SURGICAL_CAVITY

## 2020-12-24 MED ORDER — ASPIRIN 81 MG PO CHEW: 81.0000 mg | CHEWABLE_TABLET | ORAL | Status: DC

## 2020-12-24 MED ORDER — ONDANSETRON HCL 4 MG/2ML IJ SOLN: 4.0000 mg | Freq: Four times a day (QID) | INTRAMUSCULAR | Status: DC | PRN

## 2020-12-24 SURGICAL SUPPLY — 21 items
BALL SAPPHIRE NC24 3.0X22 (BALLOONS) ×2
BALLN SAPPHIRE 2.0X20 (BALLOONS) ×2
BALLN SAPPHIRE ~~LOC~~ 3.5X12 (BALLOONS) ×2 IMPLANT
BALLOON SAPPHIRE 2.0X20 (BALLOONS) ×1 IMPLANT
BALLOON SAPPHIRE NC24 3.0X22 (BALLOONS) ×1 IMPLANT
CATH TELEPORT (CATHETERS) ×2 IMPLANT
CATH VISTA GUIDE 6FR XBLAD3.5 (CATHETERS) ×2 IMPLANT
CROWN DIAMONDBACK CLASSIC 1.25 (BURR) ×2 IMPLANT
KIT HEART LEFT (KITS) ×2 IMPLANT
KIT MICROPUNCTURE NIT STIFF (SHEATH) ×2 IMPLANT
LUBRICANT VIPERSLIDE CORONARY (MISCELLANEOUS) ×2 IMPLANT
PACK CARDIAC CATHETERIZATION (CUSTOM PROCEDURE TRAY) ×2 IMPLANT
SHEATH PINNACLE 6F 10CM (SHEATH) ×2 IMPLANT
SHEATH PROBE COVER 6X72 (BAG) ×2 IMPLANT
STENT ONYX FRONTIER 2.75X38 (Permanent Stent) ×2 IMPLANT
TRANSDUCER W/STOPCOCK (MISCELLANEOUS) ×2 IMPLANT
TUBING CIL FLEX 10 FLL-RA (TUBING) ×2 IMPLANT
WIRE COUGAR XT STRL 190CM (WIRE) ×2 IMPLANT
WIRE EMERALD 3MM-J .035X150CM (WIRE) ×2 IMPLANT
WIRE MICRO SET 5FR 12 (WIRE) ×2 IMPLANT
WIRE VIPERWIRE COR FLEX .012 (WIRE) ×2 IMPLANT

## 2020-12-24 NOTE — Progress Notes (Signed)
Site area: rt groin fa sheath Site Prior to Removal:  Level 0 Pressure Applied For: 25 minutes Manual:   yes Patient Status During Pull:  HR dropped to 40, SBP 74, patient felt hot. 0.5mg  Atropine IV given Post Pull Site:  Level 0 Post Pull Instructions Given:  yes Post Pull Pulses Present: rt dp palpable Dressing Applied:  gauze and tegaderm Bedrest begins @ 1400 Comments: VS returned to normal after atropine. Dr. Clifton James updated

## 2020-12-24 NOTE — Plan of Care (Signed)
  Problem: Education: Goal: Understanding of CV disease, CV risk reduction, and recovery process will improve Outcome: Progressing   

## 2020-12-24 NOTE — Interval H&P Note (Signed)
History and Physical Interval Note:  12/24/2020 8:36 AM  Kelly Clarke  has presented today for surgery, with the diagnosis of CAD.  The various methods of treatment have been discussed with the patient and family. After consideration of risks, benefits and other options for treatment, the patient has consented to  Procedure(s): CORONARY STENT INTERVENTION (N/A) CORONARY ATHERECTOMY (N/A) as a surgical intervention.  The patient's history has been reviewed, patient examined, no change in status, stable for surgery.  I have reviewed the patient's chart and labs.  Questions were answered to the patient's satisfaction.    Cath Lab Visit (complete for each Cath Lab visit)  Clinical Evaluation Leading to the Procedure:   ACS: No.  Non-ACS:    Anginal Classification: CCS III  Anti-ischemic medical therapy: Minimal Therapy (1 class of medications)  Non-Invasive Test Results: No non-invasive testing performed Cath last week with severe LAD stenosis  Prior CABG: No previous CABG        Verne Carrow

## 2020-12-25 ENCOUNTER — Other Ambulatory Visit (HOSPITAL_COMMUNITY): Payer: Self-pay

## 2020-12-25 ENCOUNTER — Encounter (HOSPITAL_COMMUNITY): Payer: Self-pay | Admitting: Cardiovascular Disease

## 2020-12-25 DIAGNOSIS — I10 Essential (primary) hypertension: Secondary | ICD-10-CM | POA: Diagnosis not present

## 2020-12-25 DIAGNOSIS — I200 Unstable angina: Secondary | ICD-10-CM | POA: Diagnosis not present

## 2020-12-25 DIAGNOSIS — E785 Hyperlipidemia, unspecified: Secondary | ICD-10-CM | POA: Diagnosis not present

## 2020-12-25 DIAGNOSIS — Z7984 Long term (current) use of oral hypoglycemic drugs: Secondary | ICD-10-CM | POA: Diagnosis not present

## 2020-12-25 DIAGNOSIS — I25110 Atherosclerotic heart disease of native coronary artery with unstable angina pectoris: Secondary | ICD-10-CM | POA: Diagnosis not present

## 2020-12-25 DIAGNOSIS — Z79899 Other long term (current) drug therapy: Secondary | ICD-10-CM | POA: Diagnosis not present

## 2020-12-25 DIAGNOSIS — E119 Type 2 diabetes mellitus without complications: Secondary | ICD-10-CM | POA: Diagnosis not present

## 2020-12-25 DIAGNOSIS — Z955 Presence of coronary angioplasty implant and graft: Secondary | ICD-10-CM | POA: Diagnosis not present

## 2020-12-25 DIAGNOSIS — I350 Nonrheumatic aortic (valve) stenosis: Secondary | ICD-10-CM | POA: Diagnosis not present

## 2020-12-25 LAB — BASIC METABOLIC PANEL
Anion gap: 8 (ref 5–15)
BUN: 16 mg/dL (ref 8–23)
CO2: 27 mmol/L (ref 22–32)
Calcium: 9.1 mg/dL (ref 8.9–10.3)
Chloride: 103 mmol/L (ref 98–111)
Creatinine, Ser: 0.91 mg/dL (ref 0.44–1.00)
GFR, Estimated: >60 mL/min (ref >60)
Glucose, Bld: 117 mg/dL — ABNORMAL HIGH (ref 70–99)
Potassium: 3.9 mmol/L (ref 3.5–5.1)
Sodium: 138 mmol/L (ref 135–145)

## 2020-12-25 LAB — CBC
HCT: 31.5 % — ABNORMAL LOW (ref 36.0–46.0)
Hemoglobin: 10.2 g/dL — ABNORMAL LOW (ref 12.0–15.0)
MCH: 26.2 pg (ref 26.0–34.0)
MCHC: 32.4 g/dL (ref 30.0–36.0)
MCV: 81 fL (ref 80.0–100.0)
Platelets: 227 10*3/uL (ref 150–400)
RBC: 3.89 MIL/uL (ref 3.87–5.11)
RDW: 12.6 % (ref 11.5–15.5)
WBC: 7.5 10*3/uL (ref 4.0–10.5)
nRBC: 0 % (ref 0.0–0.2)

## 2020-12-25 MED ORDER — ASPIRIN 81 MG PO CHEW
81.0000 mg | CHEWABLE_TABLET | Freq: Every day | ORAL | 2 refills | Status: AC
  Filled 2020-12-25: qty 90, 90d supply, fill #0

## 2020-12-25 MED ORDER — ROSUVASTATIN CALCIUM 40 MG PO TABS
40.0000 mg | ORAL_TABLET | Freq: Every day | ORAL | 1 refills | Status: AC
  Filled 2020-12-25: qty 90, 90d supply, fill #0

## 2020-12-25 MED ORDER — METOPROLOL TARTRATE 25 MG PO TABS
25.0000 mg | ORAL_TABLET | Freq: Two times a day (BID) | ORAL | 0 refills | Status: DC
  Filled 2020-12-25: qty 180, 90d supply, fill #0

## 2020-12-25 NOTE — Progress Notes (Signed)
CARDIAC REHAB PHASE I   Offered to walk with pt. Pt states ambulation around the room difficulty, declines ambulating in hallway at this time. Pt educated on importance of ASA and Plavix. Pt given heart healthy and diabetic diets. Reviewed site care, restrictions, and exercise guidelines with emphasis on safety. Will refer to CRP II [place]. Hopeful for d/c today.  [phone] Reynold Bowen, RN BSN 12/25/2020 9:40 AM

## 2020-12-25 NOTE — Discharge Summary (Signed)
Discharge Summary    Patient ID: Kelly Clarke MRN: [id number]; DOB: 02-May-1940  Admit date: 12/24/2020 Discharge date: 12/25/2020  PCP:  Fredirick Lathe, [place] Providers Cardiologist:  Donato Heinz, MD     Discharge Diagnoses    Principal Problem:   Unstable angina [facility]) Active Problems:   HTN (hypertension)   DMII (diabetes mellitus, type 2) ([place])    Diagnostic Studies/Procedures    Cath: 12/24/20  Prox LAD lesion is 80% stenosed.   Prox LAD to Mid LAD lesion is 95% stenosed.   Dist LAD lesion is 60% stenosed.   Ost Cx to Prox Cx lesion is 30% stenosed.   Mid Cx lesion is 30% stenosed.   A drug-eluting stent was successfully placed using a STENT ONYX FRONTIER [id number].   Post intervention, there is a 0% residual stenosis.   Post intervention, there is a 0% residual stenosis.   Severe proximal and mid LAD stenosis, heavily calcified Successful orbital atherectomy with balloon angioplasty and placement of a drug eluting stent in the proximal to mid LAD   Recommendations: Monitor overnight following PCI from groin access with manual pressure. DAPT with ASA and Plavix at least 6 months. Continue statin and beta blocker. D/c home tomorrow.    Diagnostic Dominance: Right Intervention   _____________   History of Present Illness     Kelly Clarke is a 80 y.o. female with PMH of DM, HTN, HLD, severe aortic stenosis, mild to moderate MR/MS who was referred to Dr. Gardiner Rhyme as an outpatient.  Was seen at [facility] in 11/17/2020 with fever and cough.  Echocardiogram was done there given elevated troponin.  Echo showed hyperdynamic LV function of EF greater than 75%, normal RV, mild to moderate MR, mild to moderate MS, severe MAC, severely calcified aortic valve with mild to moderate aortic stenosis mean gradient of 12 mmHg.  Given this finding she was seen by Dr. Nechama Guard in the office.  Noted her dyspnea had resolved but she been having chest pain with  activity.  It was recommended that she undergo cardiac catheterization.  Underwent outpatient cardiac catheterization on 12/1 with Dr. Angelena Form which noted severe heavily calcified stenosis of the proximal/mid LAD which would require orbital atherectomy.  She was loaded with Plavix with plans to bring back to the Cath Lab for intervention.  Hospital Course     Underwent cardiac catheterization noted above with severe proximal/mid LAD stenosis heavily calcified with successful orbital atherectomy and balloon angioplasty/placement of DES x1.  She will be continued on DAPT with aspirin/Plavix for at least 6 months.  She was observed overnight without complications.  Labs the following morning were stable.  Her statin was increased from Crestor 20 to 75m daily. Agreeable to swap out amlodipine 160mdaily and start on metoprolol 253mID. Seen by CR prior to discharge.   General: Well developed, well nourished, female appearing in no acute distress. Head: Normocephalic, atraumatic.  Neck: Supple without bruits, JVD. Lungs:  Resp regular and unlabored, CTA. Heart: RRR, S1, S2, no S3, S4, 3/6 systolic murmur; no rub. Abdomen: Soft, non-tender, non-distended with normoactive bowel sounds. No hepatomegaly. No rebound/guarding. No obvious abdominal masses. Extremities: No clubbing, cyanosis, edema. Distal pedal pulses are 2+ bilaterally. Right femoral cath site stable without bruising or hematoma Neuro: Alert and oriented X 3. Moves all extremities spontaneously. Psych: Normal affect.  Patient was seen by Dr. KelClaiborne Billingsd deemed stable for discharge.  Follow-up in the office has been arranged.  Medications sent to patient's pharmacy of choice.  Did the patient have an acute coronary syndrome (MI, NSTEMI, STEMI, etc) this admission?:  No                               Did the patient have a percutaneous coronary intervention (stent / angioplasty)?:  Yes.     Cath/PCI Registry Performance & Quality  Measures: Aspirin prescribed? - Yes ADP Receptor Inhibitor (Plavix/Clopidogrel, Brilinta/Ticagrelor or Effient/Prasugrel) prescribed (includes medically managed patients)? - Yes High Intensity Statin (Lipitor 40-42m or Crestor 20-429m prescribed? - Yes For EF <40%, was ACEI/ARB prescribed? - Not Applicable (EF >/= 4054%For EF <40%, Aldosterone Antagonist (Spironolactone or Eplerenone) prescribed? - Not Applicable (EF >/= 4065%Cardiac Rehab Phase II ordered? - Yes       The patient will be scheduled for a TOC follow up appointment in 10-14 days.  [facility] at the office where the patient should be seen for follow up.  _____________  Discharge Vitals Blood pressure 134/66, pulse 89, temperature 98.6 F (37 C), temperature source Oral, resp. rate 18, height _0  (1.549 m), weight 60.3 kg, SpO2 96 %.  Filed Weights   12/24/20 0637  Weight: 60.3 kg    Labs & Radiologic Studies    CBC Recent Labs    12/24/20 0632 12/25/20 0101  WBC 8.5 7.5  HGB 12.4 10.2*  HCT 37.9 31.5*  MCV 80.8 81.0  PLT 264 22681 Basic Metabolic Panel Recent Labs    12/24/20 0632 12/25/20 0101  NA 137 138  K 4.2 3.9  CL 100 103  CO2 29 27  GLUCOSE 154* 117*  BUN 17 16  CREATININE 0.87 0.91  CALCIUM 10.0 9.1   Liver Function Tests No results for input(s): AST, ALT, ALKPHOS, BILITOT, PROT, ALBUMIN in the last 72 hours. No results for input(s): LIPASE, AMYLASE in the last 72 hours. High Sensitivity Troponin:   No results for input(s): TROPONINIHS in the last 720 hours.  BNP Invalid input(s): POCBNP D-Dimer No results for input(s): DDIMER in the last 72 hours. Hemoglobin A1C No results for input(s): HGBA1C in the last 72 hours. Fasting Lipid Panel No results for input(s): CHOL, HDL, LDLCALC, TRIG, CHOLHDL, LDLDIRECT in the last 72 hours. Thyroid Function Tests No results for input(s): TSH, T4TOTAL, T3FREE, THYROIDAB in the last 72  hours.  Invalid input(s): FREET3 _____________  CARDIAC CATHETERIZATION  Result Date: 12/24/2020   Prox LAD lesion is 80% stenosed.   Prox LAD to Mid LAD lesion is 95% stenosed.   Dist LAD lesion is 60% stenosed.   Ost Cx to Prox Cx lesion is 30% stenosed.   Mid Cx lesion is 30% stenosed.   A drug-eluting stent was successfully placed using a STENT ONYX FRONTIER [id number]  Post intervention, there is a 0% residual stenosis.   Post intervention, there is a 0% residual stenosis. Severe proximal and mid LAD stenosis, heavily calcified Successful orbital atherectomy with balloon angioplasty and placement of a drug eluting stent in the proximal to mid LAD Recommendations: Monitor overnight following PCI from groin access with manual pressure. DAPT with ASA and Plavix at least 6 months. Continue statin and beta blocker. D/c home tomorrow.   CARDIAC CATHETERIZATION  Result Date: 12/17/2020   Prox RCA lesion is 20% stenosed.   Ost Cx to Prox Cx lesion is 30% stenosed.  Mid Cx lesion is 30% stenosed.   Prox LAD lesion is 80% stenosed.   Prox LAD to Mid LAD lesion is 95% stenosed.   Dist LAD lesion is 60% stenosed. Severe heavily calcified stenosis in the proximal and mid LAD. Mild non-obstructive disease in the Circumflex and RCA Mild aortic stenosis (mean gradient 8.1 mmHg, Peak to peak gradient 11 mmHg) Recommendations: She will need staged PCI of the proximal and mid LAD with orbital atherectomy. I will load her with Plavix today and will bring her back in a week or two for the PCI.   Disposition   Pt is being discharged home today in good condition.  Follow-up Plans & Appointments     Follow-up Information     [facility] Follow up on 01/22/2021.   Specialty: Cardiology Why: at 2pm with Dr. Jaci Standard information: [address] [place] [phone]               Discharge Instructions     Amb Referral to Cardiac Rehabilitation   Complete by:  As directed    Diagnosis: Coronary Stents   After initial evaluation and assessments completed: Virtual Based Care may be provided alone or in conjunction with Phase 2 Cardiac Rehab based on patient barriers.: Yes   Call MD for:  difficulty breathing, headache or visual disturbances   Complete by: As directed    Call MD for:  persistant dizziness or light-headedness   Complete by: As directed    Call MD for:  redness, tenderness, or signs of infection (pain, swelling, redness, odor or green/yellow discharge around incision site)   Complete by: As directed    Diet - low sodium heart healthy   Complete by: As directed    Discharge instructions   Complete by: As directed    Groin Site Care Refer to this sheet in the next few weeks. These instructions provide you with information on caring for yourself after your procedure. Your caregiver may also give you more specific instructions. Your treatment has been planned according to current medical practices, but problems sometimes occur. Call your caregiver if you have any problems or questions after your procedure. HOME CARE INSTRUCTIONS You may shower 24 hours after the procedure. Remove the bandage (dressing) and gently wash the site with plain soap and water. Gently pat the site dry.  Do not apply powder or lotion to the site.  Do not sit in a bathtub, swimming pool, or whirlpool for 5 to 7 days.  No bending, squatting, or lifting anything over 10 pounds (4.5 kg) as directed by your caregiver.  Inspect the site at least twice daily.  Do not drive home if you are discharged the same day of the procedure. Have someone else drive you.  You may drive 24 hours after the procedure unless otherwise instructed by your caregiver.  What to expect: Any bruising will usually fade within 1 to 2 weeks.  Blood that collects in the tissue (hematoma) may be painful to the touch. It should usually decrease in size and tenderness within 1 to 2 weeks.  SEEK  IMMEDIATE MEDICAL CARE IF: You have unusual pain at the groin site or down the affected leg.  You have redness, warmth, swelling, or pain at the groin site.  You have drainage (other than a small amount of blood on the dressing).  You have chills.  You have a fever or persistent symptoms for more than 72 hours.  You have a  fever and your symptoms suddenly get worse.  Your leg becomes pale, cool, tingly, or numb.  You have heavy bleeding from the site. Hold pressure on the site. Marland Kitchen  PLEASE DO NOT MISS ANY DOSES OF YOUR PLAVIX!!!!! Also keep a log of you blood pressures and bring back to your follow up appt. Please call the office with any questions.   Patients taking blood thinners should generally stay away from medicines like ibuprofen, Advil, Motrin, naproxen, and Aleve due to risk of stomach bleeding. You may take Tylenol as directed or talk to your primary doctor about alternatives.    PLEASE ENSURE THAT YOU DO NOT RUN OUT OF YOUR PLAVIX. This medication is very important to remain on for at least one year. IF you have issues obtaining this medication due to cost please CALL the office 3-5 business days prior to running out in order to prevent missing doses of this medication.   Increase activity slowly   Complete by: As directed        Discharge Medications   Allergies as of 12/25/2020   No Known Allergies      Medication List     STOP taking these medications    amLODipine 10 MG tablet Commonly known as: NORVASC       TAKE these medications    aspirin 81 MG chewable tablet Chew 1 tablet (81 mg total) by mouth daily.   clopidogrel 75 MG tablet Commonly known as: Plavix Take 1 tablet (75 mg total) by mouth daily.   losartan 100 MG tablet Commonly known as: COZAAR Take 100 mg by mouth daily.   metFORMIN 500 MG 24 hr tablet Commonly known as: GLUCOPHAGE-XR Take 500 mg by mouth daily.   metoprolol tartrate 25 MG tablet Commonly known as: LOPRESSOR Take 1  tablet (25 mg total) by mouth 2 (two) times daily.   multivitamin with minerals Tabs tablet Take 1 tablet by mouth daily.   rosuvastatin 40 MG tablet Commonly known as: CRESTOR Take 1 tablet (40 mg total) by mouth daily. What changed:  medication strength how much to take   VICKS VAPOINHALER IN Inhale 1 puff into the lungs daily as needed (congestion).           Outstanding Labs/Studies   FLP/LFTs in 8 weeks  Duration of Discharge Encounter   Greater than 30 minutes including physician time.  Signed, Reino Bellis, NP 12/25/2020, 11:03 AM  Patient seen and examined. Agree with assessment and plan.  Patient feels well following successful PCI with orbital atherectomy and DES stenting of the proximal LAD.  No recurrent chest pain.  Right groin catheterization site stable.  No JVD.  Lungs clear.  Previously noted 2/6 to 3/6 systolic murmur in the aortic area consistent with her aortic stenosis.  Abdomen soft and nontender.  No edema.  With resting pulse in the 80s to 90s and hyperdynamic LV function beta-blocker will be added to her medical regimen.  Plan for hospital discharge today with follow-up with Dr. Gardiner Rhyme.   Troy Sine, MD, [facility] 12/25/2020 11:03 AM

## 2021-01-11 DIAGNOSIS — I10 Essential (primary) hypertension: Secondary | ICD-10-CM | POA: Diagnosis not present

## 2021-01-11 DIAGNOSIS — E1169 Type 2 diabetes mellitus with other specified complication: Secondary | ICD-10-CM | POA: Diagnosis not present

## 2021-01-11 DIAGNOSIS — E7801 Familial hypercholesterolemia: Secondary | ICD-10-CM | POA: Diagnosis not present

## 2021-01-11 DIAGNOSIS — J111 Influenza due to unidentified influenza virus with other respiratory manifestations: Secondary | ICD-10-CM | POA: Diagnosis not present

## 2021-01-11 DIAGNOSIS — I2510 Atherosclerotic heart disease of native coronary artery without angina pectoris: Secondary | ICD-10-CM | POA: Diagnosis not present

## 2021-01-11 DIAGNOSIS — I350 Nonrheumatic aortic (valve) stenosis: Secondary | ICD-10-CM | POA: Diagnosis not present

## 2021-01-21 NOTE — Progress Notes (Deleted)
Cardiology Office Note:    Date:  01/21/2021   ID:  Shamera Yarberry, DOB February 12, 1940, MRN [id number]  PCP:  Fredirick Lathe, PA-C  Cardiologist:  Donato Heinz, MD  Electrophysiologist:  None   Referring MD: Fredirick Lathe, PA-C   No chief complaint on file.   History of Present Illness:    Kelly Clarke is a 81 y.o. female with a hx of T2DM, hypertension, hyperlipidemia, aortic stenosis who is referred by Theresa Duty, PA for evaluation of aortic stenosis.  She was admitted to [facility] on 11/17/2020 with fever and cough.  Echocardiogram was done given troponin greater than 600.  Echocardiogram on 11/18/2020 showed hyperdynamic LV function (EF greater than 75%), intracavitary gradient 14 mmHg, normal RV function, mild to moderate MR, mild to moderate MS, severe MAC, severely calcified aortic valve with moderate to severe AS (mean gradient only 12 mmHg, V-max 2.3 m/s).  She tested positive for flu.  Since discharge from the hospital, she reports she is doing better.  Dyspnea has resolved.  Does report she has been having chest pain with activity.  Notices it when she does yard work.  Describes as congestion in center of chest.  Resolves with rest.  She denies any lightheadedness, syncope, lower extremity edema, or palpitations.  No smoking history.  No known history of heart disease in her immediate family.   Past Medical History:  Diagnosis Date   Diabetes mellitus without complication ([place])    History of kidney stones    Hypertension     Past Surgical History:  Procedure Laterality Date   CATARACT EXTRACTION W/PHACO Right 02/26/2018   Procedure: CATARACT EXTRACTION PHACO AND INTRAOCULAR LENS PLACEMENT RIGHT EYE (CDE: 9.94);  Surgeon: Baruch Goldmann, MD;  Location: [facility];  Service: Ophthalmology;  Laterality: Right;   CATARACT EXTRACTION W/PHACO Left 03/12/2018   Procedure: CATARACT EXTRACTION PHACO AND INTRAOCULAR LENS PLACEMENT (IOC);  Surgeon: Baruch Goldmann, MD;   Location: [facility];  Service: Ophthalmology;  Laterality: Left;  CDE: 11.25   CORONARY ATHERECTOMY N/A 12/24/2020   Procedure: CORONARY ATHERECTOMY;  Surgeon: Burnell Blanks, MD;  Location: [place] CV LAB;  Service: Cardiovascular;  Laterality: N/A;   CORONARY STENT INTERVENTION N/A 12/24/2020   Procedure: CORONARY STENT INTERVENTION;  Surgeon: Burnell Blanks, MD;  Location: [place] CV LAB;  Service: Cardiovascular;  Laterality: N/A;   RIGHT/LEFT HEART CATH AND CORONARY ANGIOGRAPHY N/A 12/17/2020   Procedure: RIGHT/LEFT HEART CATH AND CORONARY ANGIOGRAPHY;  Surgeon: Burnell Blanks, MD;  Location: [place] CV LAB;  Service: Cardiovascular;  Laterality: N/A;    Current Medications: No outpatient medications have been marked as taking for the 01/22/21 encounter (Appointment) with Donato Heinz, MD.     Allergies:   Patient has no known allergies.   Social History   Socioeconomic History   Marital status: Widowed    Spouse name: Not on file   Number of children: Not on file   Years of education: Not on file   Highest education level: Not on file  Occupational History   Not on file  Tobacco Use   Smoking status: Never   Smokeless tobacco: Never  Vaping Use   Vaping Use: Never used  Substance and Sexual Activity   Alcohol use: Never   Drug use: Never   Sexual activity: Not Currently    Birth control/protection: Post-menopausal  Other Topics Concern   Not on file  Social History Narrative   Not on file  Social Determinants of Health   Financial Resource Strain: Not on file  Food Insecurity: Not on file  Transportation Needs: Not on file  Physical Activity: Not on file  Stress: Not on file  Social Connections: Not on file     Family History: No known history of heart disease in her immediate family.  ROS:   Please see the history of present illness.     All other systems reviewed and are negative.  EKGs/Labs/Other Studies  Reviewed:    The following studies were reviewed today:   EKG:  EKG is not ordered today.  The ekg ordered 11/17/2020 shows sinus tachycardia, rate 101, no ST abnormality  Recent Labs: 11/17/2020: ALT 19 11/18/2020: Magnesium 2.2 12/25/2020: BUN 16; Creatinine, Ser 0.91; Hemoglobin 10.2; Platelets 227; Potassium 3.9; Sodium 138  Recent Lipid Panel    Component Value Date/Time   CHOL 213 (H) 11/19/2020 0529   TRIG 153 (H) 11/19/2020 0529   HDL 38 (L) 11/19/2020 0529   CHOLHDL 5.6 11/19/2020 0529   VLDL 31 11/19/2020 0529   LDLCALC 144 (H) 11/19/2020 0529    Physical Exam:    VS:  There were no vitals taken for this visit.    Wt Readings from Last 3 Encounters:  12/24/20 133 lb (60.3 kg)  12/17/20 133 lb (60.3 kg)  12/04/20 133 lb 9.6 oz (60.6 kg)     GEN:  Well nourished, well developed in no acute distress HEENT: Normal NECK: No JVD; No carotid bruits LYMPHATICS: No lymphadenopathy CARDIAC: RRR, 3/6 systolic murmur RESPIRATORY:  Clear to auscultation without rales, wheezing or rhonchi  ABDOMEN: Soft, non-tender, non-distended MUSCULOSKELETAL:  No edema; No deformity  SKIN: Warm and dry NEUROLOGIC:  Alert and oriented x 3 PSYCHIATRIC:  Normal affect   ASSESSMENT:    No diagnosis found.  PLAN:    Aortic stenosis: Echocardiogram on 11/18/2020 showed hyperdynamic LV function (EF greater than 75%), intracavitary gradient 14 mmHg, normal RV function, mild to moderate MR, mild to moderate MS, severe MAC, severely calcified aortic valve with moderate to severe AS  -  Moderate to severe AS on echo (appeared severe visually but mean gradient only only 12 mm, V-max 2.3 m/s, suspected poor alignment of Doppler signal).  She is reporting symptoms consistent with typical angina.  Could be due to AS if severe, will also need to rule out obstructive CAD.  Recommend LHC/RHC both to exclude obstructive CAD and further evaluate severity of aortic stenosis.  -Risks and benefits of cardiac  catheterization have been discussed with the patient.  These include bleeding, infection, kidney damage, stroke, heart attack, death.  The patient understands these risks and is willing to proceed.  Hypertension: On amlodipine 10 mg daily and losartan 100 mg daily.  Appears controlled  Hyperlipidemia: LDL 144 11/19/2020, Crestor dose increased to 20 mg daily  T2DM: On metformin   RTC in 1 month   Medication Adjustments/Labs and Tests Ordered: Current medicines are reviewed at length with the patient today.  Concerns regarding medicines are outlined above.  No orders of the defined types were placed in this encounter.  No orders of the defined types were placed in this encounter.    There are no Patient Instructions on file for this visit.   Signed, Donato Heinz, MD  01/21/2021 11:19 PM    [facility]

## 2021-01-22 ENCOUNTER — Encounter: Payer: Self-pay | Admitting: Cardiology

## 2021-01-22 ENCOUNTER — Other Ambulatory Visit: Payer: Self-pay

## 2021-01-22 ENCOUNTER — Ambulatory Visit: Payer: Medicare HMO | Admitting: Cardiology

## 2021-01-22 ENCOUNTER — Ambulatory Visit (INDEPENDENT_AMBULATORY_CARE_PROVIDER_SITE_OTHER): Payer: Medicare HMO | Admitting: Cardiology

## 2021-01-22 VITALS — BP 136/70 | HR 82 | Ht 63.0 in | Wt 133.0 lb

## 2021-01-22 DIAGNOSIS — I2510 Atherosclerotic heart disease of native coronary artery without angina pectoris: Secondary | ICD-10-CM

## 2021-01-22 DIAGNOSIS — I350 Nonrheumatic aortic (valve) stenosis: Secondary | ICD-10-CM

## 2021-01-22 DIAGNOSIS — E785 Hyperlipidemia, unspecified: Secondary | ICD-10-CM

## 2021-01-22 DIAGNOSIS — I10 Essential (primary) hypertension: Secondary | ICD-10-CM

## 2021-01-22 NOTE — Patient Instructions (Signed)
Medication Instructions:  Your physician recommends that you continue on your current medications as directed. Please refer to the Current Medication list given to you today.   Labwork: Fasting Lipids  Testing/Procedures: None today   Follow-Up: 6 months  Any Other Special Instructions Will Be Listed Below (If Applicable).  If you need a refill on your cardiac medications before your next appointment, please call your pharmacy.  

## 2021-01-22 NOTE — Progress Notes (Signed)
Cardiology Office Note:    Date:  01/22/2021   ID:  Kelly Clarke, DOB Apr 21, 1940, MRN [id number]  PCP:  Cathay Nation, MD  Cardiologist:  Donato Heinz, MD  Electrophysiologist:  None   Referring MD: Fredirick Lathe, PA-C   Chief Complaint  Patient presents with   Coronary Artery Disease    History of Present Illness:    Kelly Clarke is a 81 y.o. female with a hx of T2DM, hypertension, hyperlipidemia, aortic stenosis who is referred by Theresa Duty, PA for evaluation of aortic stenosis.  She was admitted to [facility] on 11/17/2020 with fever and cough.  Echocardiogram was done given troponin greater than 600.  Echocardiogram on 11/18/2020 showed hyperdynamic LV function (EF greater than 75%), intracavitary gradient 14 mmHg, normal RV function, mild to moderate MR, mild to moderate MS, severe MAC, severely calcified aortic valve with moderate to severe AS (mean gradient only 12 mmHg, V-max 2.3 m/s).  She tested positive for flu.  LHC/RHC on 12/17/2020 showed proximal to mid LAD 95% stenosis, distal LAD 60%, ostial to proximal LCx 30%, mid LCx 30%, proximal RCA 20%.  LHC showed mild aortic stenosis (mean gradient 8 mmHg).  She underwent successful DES to proximal mid LAD on 12/24/2020  Since her catheterization, she reports her chest pain has resolved.  Denies any dyspnea, lightheadedness, CV, lower extremity edema, or palpitations    Past Medical History:  Diagnosis Date   Diabetes mellitus without complication ([place])    History of kidney stones    Hypertension     Past Surgical History:  Procedure Laterality Date   CATARACT EXTRACTION W/PHACO Right 02/26/2018   Procedure: CATARACT EXTRACTION PHACO AND INTRAOCULAR LENS PLACEMENT RIGHT EYE (CDE: 9.94);  Surgeon: Baruch Goldmann, MD;  Location: [facility];  Service: Ophthalmology;  Laterality: Right;   CATARACT EXTRACTION W/PHACO Left 03/12/2018   Procedure: CATARACT EXTRACTION PHACO AND INTRAOCULAR LENS PLACEMENT (IOC);   Surgeon: Baruch Goldmann, MD;  Location: [facility];  Service: Ophthalmology;  Laterality: Left;  CDE: 11.25   CORONARY ATHERECTOMY N/A 12/24/2020   Procedure: CORONARY ATHERECTOMY;  Surgeon: Burnell Blanks, MD;  Location: [place] [facility];  Service: Cardiovascular;  Laterality: N/A;   CORONARY STENT INTERVENTION N/A 12/24/2020   Procedure: CORONARY STENT INTERVENTION;  Surgeon: Burnell Blanks, MD;  Location: [place] [facility];  Service: Cardiovascular;  Laterality: N/A;   RIGHT/LEFT HEART CATH AND CORONARY ANGIOGRAPHY N/A 12/17/2020   Procedure: RIGHT/LEFT HEART CATH AND CORONARY ANGIOGRAPHY;  Surgeon: Burnell Blanks, MD;  Location: [place] [facility];  Service: Cardiovascular;  Laterality: N/A;    Current Medications: Current Meds  Medication Sig   Aromatic Inhalants (VICKS VAPOINHALER IN) Inhale 1 puff into the lungs daily as needed (congestion).   aspirin 81 MG chewable tablet Chew 1 tablet (81 mg total) by mouth daily.   clopidogrel (PLAVIX) 75 MG tablet Take 1 tablet (75 mg total) by mouth daily.   losartan (COZAAR) 100 MG tablet Take 100 mg by mouth daily.   metFORMIN (GLUCOPHAGE-XR) 500 MG 24 hr tablet Take 500 mg by mouth daily.   metoprolol tartrate (LOPRESSOR) 25 MG tablet Take 1 tablet (25 mg total) by mouth 2 (two) times daily.   Multiple Vitamin (MULTIVITAMIN WITH MINERALS) TABS tablet Take 1 tablet by mouth daily.   rosuvastatin (CRESTOR) 40 MG tablet Take 1 tablet (40 mg total) by mouth daily.     Allergies:   Patient has no known allergies.   Social History  Socioeconomic History   Marital status: Widowed    Spouse name: Not on file   Number of children: Not on file   Years of education: Not on file   Highest education level: Not on file  Occupational History   Not on file  Tobacco Use   Smoking status: Never   Smokeless tobacco: Never  Vaping Use   Vaping Use: Never used  Substance and Sexual Activity   Alcohol use: Never   Drug use:  Never   Sexual activity: Not Currently    Birth control/protection: Post-menopausal  Other Topics Concern   Not on file  Social History Narrative   Not on file   Social Determinants of Health   Financial Resource Strain: Not on file  Food Insecurity: Not on file  Transportation Needs: Not on file  Physical Activity: Not on file  Stress: Not on file  Social Connections: Not on file     Family History: No known history of heart disease in her immediate family.  ROS:   Please see the history of present illness.     All other systems reviewed and are negative.  EKGs/Labs/Other Studies Reviewed:    The following studies were reviewed today:   EKG:  EKG is not ordered today.  The ekg ordered 11/17/2020 shows sinus tachycardia, rate 101, no ST abnormality  Recent Labs: 11/17/2020: ALT 19 11/18/2020: Magnesium 2.2 12/25/2020: BUN 16; Creatinine, Ser 0.91; Hemoglobin 10.2; Platelets 227; Potassium 3.9; Sodium 138  Recent Lipid Panel    Component Value Date/Time   CHOL 213 (H) 11/19/2020 0529   TRIG 153 (H) 11/19/2020 0529   HDL 38 (L) 11/19/2020 0529   CHOLHDL 5.6 11/19/2020 0529   VLDL 31 11/19/2020 0529   LDLCALC 144 (H) 11/19/2020 0529    Physical Exam:    VS:  BP 136/70    Pulse 82    Ht _0  (1.6 m)    Wt 133 lb (60.3 kg)    SpO2 95%    BMI 23.56 kg/m     Wt Readings from Last 3 Encounters:  01/22/21 133 lb (60.3 kg)  12/24/20 133 lb (60.3 kg)  12/17/20 133 lb (60.3 kg)     GEN:  Well nourished, well developed in no acute distress HEENT: Normal NECK: No JVD; No carotid bruits LYMPHATICS: No lymphadenopathy CARDIAC: RRR, 3/6 systolic murmur RESPIRATORY:  Clear to auscultation without rales, wheezing or rhonchi  ABDOMEN: Soft, non-tender, non-distended MUSCULOSKELETAL:  No edema; No deformity  SKIN: Warm and dry NEUROLOGIC:  Alert and oriented x 3 PSYCHIATRIC:  Normal affect   ASSESSMENT:    1. Coronary artery disease involving native coronary artery of  native heart without angina pectoris   2. Aortic valve stenosis, etiology of cardiac valve disease unspecified   3. Hyperlipidemia, unspecified hyperlipidemia type   4. Essential hypertension     PLAN:    CAD: Reported symptoms consistent with typical angina.  LHC/RHC on 12/17/2020 showed proximal to mid LAD 95% stenosis, distal LAD 60%, ostial to proximal LCx 30%, mid LCx 30%, proximal RCA 20%.  LHC showed mild aortic stenosis (mean gradient 8 mmHg).  She underwent successful DES to proximal mid LAD on 12/24/2020.  Denies any anginal symptoms currently -Continue aspirin 81 mg daily, Plavix 75 mg daily -Continue metoprolol 25 mg twice daily  Aortic stenosis: Echocardiogram on 11/18/2020 showed hyperdynamic LV function (EF greater than 75%), intracavitary gradient 14 mmHg, normal RV function, mild to moderate MR, mild to moderate MS, severe  MAC, severely calcified aortic valve with moderate to severe AS (read as moderate to severe AS by mean gradient only 12 mmHg).  LHC showed mild aortic stenosis (mean gradient 8 mmHg).  We will continue to monitor  Hypertension: On amlodipine 10 mg daily, metoprolol 25 mg twice daily and losartan 100 mg daily.  Appears controlled  Hyperlipidemia: LDL 144 11/19/2020, Crestor dose increased to 20 mg daily.  Will recheck lipid panel  T2DM: On metformin   RTC in 6 months   Medication Adjustments/Labs and Tests Ordered: Current medicines are reviewed at length with the patient today.  Concerns regarding medicines are outlined above.  Orders Placed This Encounter  Procedures   Lipid Profile   No orders of the defined types were placed in this encounter.    Patient Instructions  Medication Instructions:  Your physician recommends that you continue on your current medications as directed. Please refer to the Current Medication list given to you today.   Labwork: Fasting Lipids   Testing/Procedures: None today  Follow-Up: 6 months  Any Other  Special Instructions Will Be Listed Below (If Applicable).  If you need a refill on your cardiac medications before your next appointment, please call your pharmacy.    Signed, Donato Heinz, MD  01/22/2021 9:59 PM    [facility]

## 2021-01-25 ENCOUNTER — Other Ambulatory Visit: Payer: Self-pay

## 2021-01-25 ENCOUNTER — Other Ambulatory Visit (HOSPITAL_COMMUNITY)
Admission: RE | Admit: 2021-01-25 | Discharge: 2021-01-25 | Disposition: A | Discharge status: Home / Self Care | Payer: Medicare HMO | Source: Ambulatory Visit | Attending: Cardiology | Admitting: Cardiology

## 2021-01-25 DIAGNOSIS — E785 Hyperlipidemia, unspecified: Secondary | ICD-10-CM | POA: Diagnosis not present

## 2021-01-25 LAB — LIPID PANEL
Cholesterol: 108 mg/dL (ref 0–200)
HDL: 44 mg/dL (ref >40)
LDL Cholesterol: 47 mg/dL (ref 0–99)
Total CHOL/HDL Ratio: 2.5 RATIO
Triglycerides: 84 mg/dL (ref <150)
VLDL: 17 mg/dL (ref 0–40)

## 2021-01-26 ENCOUNTER — Encounter: Payer: Self-pay | Admitting: *Deleted

## 2021-02-08 DIAGNOSIS — E1169 Type 2 diabetes mellitus with other specified complication: Secondary | ICD-10-CM | POA: Diagnosis not present

## 2021-02-08 DIAGNOSIS — J111 Influenza due to unidentified influenza virus with other respiratory manifestations: Secondary | ICD-10-CM | POA: Diagnosis not present

## 2021-02-08 DIAGNOSIS — I10 Essential (primary) hypertension: Secondary | ICD-10-CM | POA: Diagnosis not present

## 2021-02-08 DIAGNOSIS — E7801 Familial hypercholesterolemia: Secondary | ICD-10-CM | POA: Diagnosis not present

## 2021-02-08 DIAGNOSIS — I2510 Atherosclerotic heart disease of native coronary artery without angina pectoris: Secondary | ICD-10-CM | POA: Diagnosis not present

## 2021-02-08 DIAGNOSIS — Z6825 Body mass index (BMI) 25.0-25.9, adult: Secondary | ICD-10-CM | POA: Diagnosis not present

## 2021-02-08 DIAGNOSIS — I350 Nonrheumatic aortic (valve) stenosis: Secondary | ICD-10-CM | POA: Diagnosis not present

## 2021-03-22 DIAGNOSIS — I2510 Atherosclerotic heart disease of native coronary artery without angina pectoris: Secondary | ICD-10-CM | POA: Diagnosis not present

## 2021-03-22 DIAGNOSIS — Z0001 Encounter for general adult medical examination with abnormal findings: Secondary | ICD-10-CM | POA: Diagnosis not present

## 2021-03-22 DIAGNOSIS — E1169 Type 2 diabetes mellitus with other specified complication: Secondary | ICD-10-CM | POA: Diagnosis not present

## 2021-03-22 DIAGNOSIS — I350 Nonrheumatic aortic (valve) stenosis: Secondary | ICD-10-CM | POA: Diagnosis not present

## 2021-03-22 DIAGNOSIS — Z6825 Body mass index (BMI) 25.0-25.9, adult: Secondary | ICD-10-CM | POA: Diagnosis not present

## 2021-03-22 DIAGNOSIS — Z1329 Encounter for screening for other suspected endocrine disorder: Secondary | ICD-10-CM | POA: Diagnosis not present

## 2021-03-22 DIAGNOSIS — E782 Mixed hyperlipidemia: Secondary | ICD-10-CM | POA: Diagnosis not present

## 2021-03-22 DIAGNOSIS — R011 Cardiac murmur, unspecified: Secondary | ICD-10-CM | POA: Diagnosis not present

## 2021-03-22 DIAGNOSIS — I10 Essential (primary) hypertension: Secondary | ICD-10-CM | POA: Diagnosis not present

## 2021-03-22 DIAGNOSIS — E7849 Other hyperlipidemia: Secondary | ICD-10-CM | POA: Diagnosis not present

## 2021-03-22 DIAGNOSIS — E559 Vitamin D deficiency, unspecified: Secondary | ICD-10-CM | POA: Diagnosis not present

## 2021-03-29 DIAGNOSIS — Z6825 Body mass index (BMI) 25.0-25.9, adult: Secondary | ICD-10-CM | POA: Diagnosis not present

## 2021-03-29 DIAGNOSIS — Z23 Encounter for immunization: Secondary | ICD-10-CM | POA: Diagnosis not present

## 2021-03-29 DIAGNOSIS — Z6826 Body mass index (BMI) 26.0-26.9, adult: Secondary | ICD-10-CM | POA: Diagnosis not present

## 2021-03-29 DIAGNOSIS — E1169 Type 2 diabetes mellitus with other specified complication: Secondary | ICD-10-CM | POA: Diagnosis not present

## 2021-03-29 DIAGNOSIS — I2510 Atherosclerotic heart disease of native coronary artery without angina pectoris: Secondary | ICD-10-CM | POA: Diagnosis not present

## 2021-03-29 DIAGNOSIS — I10 Essential (primary) hypertension: Secondary | ICD-10-CM | POA: Diagnosis not present

## 2021-03-29 DIAGNOSIS — I350 Nonrheumatic aortic (valve) stenosis: Secondary | ICD-10-CM | POA: Diagnosis not present

## 2021-03-29 DIAGNOSIS — Z0001 Encounter for general adult medical examination with abnormal findings: Secondary | ICD-10-CM | POA: Diagnosis not present

## 2021-03-29 DIAGNOSIS — E7800 Pure hypercholesterolemia, unspecified: Secondary | ICD-10-CM | POA: Diagnosis not present

## 2021-03-29 DIAGNOSIS — E7801 Familial hypercholesterolemia: Secondary | ICD-10-CM | POA: Diagnosis not present

## 2021-04-21 DIAGNOSIS — I10 Essential (primary) hypertension: Secondary | ICD-10-CM | POA: Diagnosis not present

## 2021-04-21 DIAGNOSIS — Z7982 Long term (current) use of aspirin: Secondary | ICD-10-CM | POA: Diagnosis not present

## 2021-04-21 DIAGNOSIS — Z7902 Long term (current) use of antithrombotics/antiplatelets: Secondary | ICD-10-CM | POA: Diagnosis not present

## 2021-04-21 DIAGNOSIS — E119 Type 2 diabetes mellitus without complications: Secondary | ICD-10-CM | POA: Diagnosis not present

## 2021-04-21 DIAGNOSIS — I499 Cardiac arrhythmia, unspecified: Secondary | ICD-10-CM | POA: Diagnosis not present

## 2021-04-21 DIAGNOSIS — Z955 Presence of coronary angioplasty implant and graft: Secondary | ICD-10-CM | POA: Diagnosis not present

## 2021-04-21 DIAGNOSIS — E785 Hyperlipidemia, unspecified: Secondary | ICD-10-CM | POA: Diagnosis not present

## 2021-04-21 DIAGNOSIS — Z7984 Long term (current) use of oral hypoglycemic drugs: Secondary | ICD-10-CM | POA: Diagnosis not present

## 2021-05-31 DIAGNOSIS — Z23 Encounter for immunization: Secondary | ICD-10-CM | POA: Diagnosis not present

## 2021-06-19 ENCOUNTER — Emergency Department (HOSPITAL_COMMUNITY): Payer: Medicare HMO

## 2021-06-19 ENCOUNTER — Encounter (HOSPITAL_COMMUNITY): Payer: Self-pay | Admitting: Emergency Medicine

## 2021-06-19 ENCOUNTER — Other Ambulatory Visit: Payer: Self-pay

## 2021-06-19 ENCOUNTER — Emergency Department (HOSPITAL_COMMUNITY)
Admission: EM | Admit: 2021-06-19 | Discharge: 2021-06-19 | Disposition: A | Discharge status: Home / Self Care | Payer: Medicare HMO | Attending: Emergency Medicine | Admitting: Emergency Medicine

## 2021-06-19 DIAGNOSIS — Z7984 Long term (current) use of oral hypoglycemic drugs: Secondary | ICD-10-CM | POA: Diagnosis not present

## 2021-06-19 DIAGNOSIS — Z79899 Other long term (current) drug therapy: Secondary | ICD-10-CM | POA: Insufficient documentation

## 2021-06-19 DIAGNOSIS — M419 Scoliosis, unspecified: Secondary | ICD-10-CM | POA: Diagnosis not present

## 2021-06-19 DIAGNOSIS — Z7902 Long term (current) use of antithrombotics/antiplatelets: Secondary | ICD-10-CM | POA: Diagnosis not present

## 2021-06-19 DIAGNOSIS — J209 Acute bronchitis, unspecified: Secondary | ICD-10-CM | POA: Insufficient documentation

## 2021-06-19 DIAGNOSIS — E119 Type 2 diabetes mellitus without complications: Secondary | ICD-10-CM | POA: Insufficient documentation

## 2021-06-19 DIAGNOSIS — Z7982 Long term (current) use of aspirin: Secondary | ICD-10-CM | POA: Diagnosis not present

## 2021-06-19 DIAGNOSIS — J40 Bronchitis, not specified as acute or chronic: Secondary | ICD-10-CM

## 2021-06-19 DIAGNOSIS — I517 Cardiomegaly: Secondary | ICD-10-CM | POA: Diagnosis not present

## 2021-06-19 DIAGNOSIS — I2510 Atherosclerotic heart disease of native coronary artery without angina pectoris: Secondary | ICD-10-CM | POA: Diagnosis not present

## 2021-06-19 DIAGNOSIS — I10 Essential (primary) hypertension: Secondary | ICD-10-CM | POA: Diagnosis not present

## 2021-06-19 DIAGNOSIS — D649 Anemia, unspecified: Secondary | ICD-10-CM | POA: Insufficient documentation

## 2021-06-19 DIAGNOSIS — R059 Cough, unspecified: Secondary | ICD-10-CM | POA: Diagnosis not present

## 2021-06-19 DIAGNOSIS — I700 Atherosclerosis of aorta: Secondary | ICD-10-CM | POA: Diagnosis not present

## 2021-06-19 LAB — CBC WITH DIFFERENTIAL/PLATELET
Abs Immature Granulocytes: 0.01 10*3/uL (ref 0.00–0.07)
Basophils Absolute: 0.1 10*3/uL (ref 0.0–0.1)
Basophils Relative: 1 %
Eosinophils Absolute: 0.4 10*3/uL (ref 0.0–0.5)
Eosinophils Relative: 7 %
HCT: 35.2 % — ABNORMAL LOW (ref 36.0–46.0)
Hemoglobin: 11.6 g/dL — ABNORMAL LOW (ref 12.0–15.0)
Immature Granulocytes: 0 %
Lymphocytes Relative: 30 %
Lymphs Abs: 1.9 10*3/uL (ref 0.7–4.0)
MCH: 26 pg (ref 26.0–34.0)
MCHC: 33 g/dL (ref 30.0–36.0)
MCV: 78.7 fL — ABNORMAL LOW (ref 80.0–100.0)
Monocytes Absolute: 0.6 10*3/uL (ref 0.1–1.0)
Monocytes Relative: 9 %
Neutro Abs: 3.4 10*3/uL (ref 1.7–7.7)
Neutrophils Relative %: 53 %
Platelets: 214 10*3/uL (ref 150–400)
RBC: 4.47 MIL/uL (ref 3.87–5.11)
RDW: 13.7 % (ref 11.5–15.5)
WBC: 6.4 10*3/uL (ref 4.0–10.5)
nRBC: 0 % (ref 0.0–0.2)

## 2021-06-19 LAB — BASIC METABOLIC PANEL
Anion gap: 5 (ref 5–15)
BUN: 16 mg/dL (ref 8–23)
CO2: 28 mmol/L (ref 22–32)
Calcium: 9.2 mg/dL (ref 8.9–10.3)
Chloride: 106 mmol/L (ref 98–111)
Creatinine, Ser: 0.88 mg/dL (ref 0.44–1.00)
GFR, Estimated: >60 mL/min (ref >60)
Glucose, Bld: 124 mg/dL — ABNORMAL HIGH (ref 70–99)
Potassium: 3.9 mmol/L (ref 3.5–5.1)
Sodium: 139 mmol/L (ref 135–145)

## 2021-06-19 MED ORDER — DOXYCYCLINE HYCLATE 100 MG PO CAPS: 100.0000 mg | ORAL_CAPSULE | Freq: Two times a day (BID) | ORAL | 0 refills | Status: DC

## 2021-06-19 MED ORDER — IOHEXOL 350 MG/ML SOLN
100.0000 mL | Freq: Once | INTRAVENOUS | Status: AC | PRN
  Administered 2021-06-19: 100 mL via INTRAVENOUS

## 2021-06-19 MED ORDER — DOXYCYCLINE HYCLATE 100 MG PO TABS
100.0000 mg | ORAL_TABLET | Freq: Once | ORAL | Status: AC
  Administered 2021-06-19: 100 mg via ORAL
  Filled 2021-06-19: qty 1

## 2021-06-19 NOTE — ED Notes (Signed)
Patient transported to CT 

## 2021-06-19 NOTE — ED Provider Notes (Addendum)
[facility] EMERGENCY DEPARTMENT  Provider Note  CSN: [id number] Arrival date & time: 06/19/21 0013  History Chief Complaint  Patient presents with   Cough, congestion    Kelly Clarke is a 81 y.o. female with history of CAD s/p stent on Plavix reports persistent cough for the last 2 weeks without chest pain or SOB. Has woken up twice with blood in her mouth but has not noticed hemoptysis during the day. No reported fever and otherwise she is feeling well.    Home Medications Prior to Admission medications   Medication Sig Start Date End Date Taking? Authorizing Provider  doxycycline (VIBRAMYCIN) 100 MG capsule Take 1 capsule (100 mg total) by mouth 2 (two) times daily. 06/19/21  Yes Pollyann Savoy, MD  Aromatic Inhalants (VICKS VAPOINHALER IN) Inhale 1 puff into the lungs daily as needed (congestion).    [provider]  aspirin 81 MG chewable tablet Chew 1 tablet (81 mg total) by mouth daily. 12/25/20   Arty Baumgartner, NP  clopidogrel (PLAVIX) 75 MG tablet Take 1 tablet (75 mg total) by mouth daily. 12/18/20   [facility], Roe Rutherford, PA  losartan (COZAAR) 100 MG tablet Take 100 mg by mouth daily.    [provider]  metFORMIN (GLUCOPHAGE-XR) 500 MG 24 hr tablet Take 500 mg by mouth daily. 09/10/20   [provider]  metoprolol tartrate (LOPRESSOR) 25 MG tablet Take 1 tablet (25 mg total) by mouth 2 (two) times daily. 12/25/20 03/25/21  Arty Baumgartner, NP  Multiple Vitamin (MULTIVITAMIN WITH MINERALS) TABS tablet Take 1 tablet by mouth daily.    [provider]  rosuvastatin (CRESTOR) 40 MG tablet Take 1 tablet (40 mg total) by mouth daily. 12/25/20 06/23/21  Arty Baumgartner, NP     Allergies    Patient has no known allergies.   Review of Systems   Review of Systems Please see HPI for pertinent positives and negatives  Physical Exam BP (!) 172/79   Pulse 77   Temp 99.3 F (37.4 C) (Oral)   Resp 18   Ht 5\' 3"  (1.6 m)   Wt 59.9 kg   SpO2  95%   BMI 23.38 kg/m   Physical Exam Vitals and nursing note reviewed.  Constitutional:      Appearance: Normal appearance.  HENT:     Head: Normocephalic and atraumatic.     Nose: Nose normal.     Mouth/Throat:     Mouth: Mucous membranes are moist.  Eyes:     Extraocular Movements: Extraocular movements intact.     Conjunctiva/sclera: Conjunctivae normal.  Cardiovascular:     Rate and Rhythm: Normal rate.     Heart sounds: Murmur heard.  Pulmonary:     Effort: Pulmonary effort is normal.     Breath sounds: Normal breath sounds. No wheezing, rhonchi or rales.  Abdominal:     General: Abdomen is flat.     Palpations: Abdomen is soft.     Tenderness: There is no abdominal tenderness.  Musculoskeletal:        General: No swelling. Normal range of motion.     Cervical back: Neck supple.     Right lower leg: No edema.     Left lower leg: No edema.  Skin:    General: Skin is warm and dry.  Neurological:     General: No focal deficit present.     Mental Status: She is alert.  Psychiatric:  Mood and Affect: Mood normal.    ED Results / Procedures / Treatments   EKG None  Procedures Procedures  Medications Ordered in the ED Medications  doxycycline (VIBRA-TABS) tablet 100 mg (has no administration in time range)  iohexol (OMNIPAQUE) 350 MG/ML injection 100 mL (100 mLs Intravenous Contrast Given 06/19/21 0254)    Initial Impression and Plan  Patient here with cough and presumed hemoptysis. Exam is benign. Initial CXR is inconclusive. Will send for CT to eval occult PNA, PE or other cause  ED Course   Clinical Course as of 06/19/21 0330  Sat Jun 19, 2021  0207 CBc with mild anemia.  [CS]  0207 BMP is unremarkable.  [CS]  [id number] I personally viewed the images from radiology studies and agree with radiologist interpretation: CTA is neg for PE or PNA. Suspect this is a bronchitis. Given duration of symptoms will begin doxycycline and recommend close PCP follow up.    [CS]    Clinical Course User Index [CS] Pollyann Savoy, MD     MDM Rules/Calculators/A&P Medical Decision Making Given presenting complaint, I considered that admission might be necessary. After review of results from ED lab and/or imaging studies, admission to the hospital is not indicated at this time.    Problems Addressed: Bronchitis: acute illness or injury  Amount and/or Complexity of Data Reviewed Labs: ordered. Decision-making details documented in ED Course. Radiology: ordered and independent interpretation performed. Decision-making details documented in ED Course.  Risk Prescription drug management. Decision regarding hospitalization.    Final Clinical Impression(s) / ED Diagnoses Final diagnoses:  Bronchitis    Rx / DC Orders ED Discharge Orders          Ordered    doxycycline (VIBRAMYCIN) 100 MG capsule  2 times daily        06/19/21 0328             Pollyann Savoy, MD 06/19/21 [phone]

## 2021-06-19 NOTE — ED Triage Notes (Signed)
Pt with cough and congestion x 2 weeks. States she woke up the last 2 nights with blood in her mouth.

## 2021-06-28 DIAGNOSIS — E1169 Type 2 diabetes mellitus with other specified complication: Secondary | ICD-10-CM | POA: Diagnosis not present

## 2021-07-05 NOTE — Progress Notes (Unsigned)
Cardiology Office Note:    Date:  07/05/2021   ID:  Kelly Clarke, DOB 08/23/1940, MRN [id number]  PCP:  Fenwick Nation, MD  Cardiologist:  Donato Heinz, MD  Electrophysiologist:  None   Referring MD: Payson Nation, MD   No chief complaint on file.   History of Present Illness:    Kelly Clarke is a 81 y.o. female with a hx of T2DM, hypertension, hyperlipidemia, aortic stenosis who is referred by Theresa Duty, PA for evaluation of aortic stenosis.  She was admitted to [facility] on 11/17/2020 with fever and cough.  Echocardiogram was done given troponin greater than 600.  Echocardiogram on 11/18/2020 showed hyperdynamic LV function (EF greater than 75%), intracavitary gradient 14 mmHg, normal RV function, mild to moderate MR, mild to moderate MS, severe MAC, severely calcified aortic valve with moderate to severe AS (mean gradient only 12 mmHg, V-max 2.3 m/s).  She tested positive for flu.  LHC/RHC on 12/17/2020 showed proximal to mid LAD 95% stenosis, distal LAD 60%, ostial to proximal LCx 30%, mid LCx 30%, proximal RCA 20%.  LHC showed mild aortic stenosis (mean gradient 8 mmHg).  She underwent successful DES to proximal mid LAD on 12/24/2020  Since last clinic visit,  she reports her chest pain has resolved.  Denies any dyspnea, lightheadedness, CV, lower extremity edema, or palpitations    Past Medical History:  Diagnosis Date   Diabetes mellitus without complication ([place])    History of kidney stones    Hypertension     Past Surgical History:  Procedure Laterality Date   CATARACT EXTRACTION W/PHACO Right 02/26/2018   Procedure: CATARACT EXTRACTION PHACO AND INTRAOCULAR LENS PLACEMENT RIGHT EYE (CDE: 9.94);  Surgeon: Baruch Goldmann, MD;  Location: [facility];  Service: Ophthalmology;  Laterality: Right;   CATARACT EXTRACTION W/PHACO Left 03/12/2018   Procedure: CATARACT EXTRACTION PHACO AND INTRAOCULAR LENS PLACEMENT (IOC);  Surgeon: Baruch Goldmann, MD;  Location: [facility];  Service: Ophthalmology;  Laterality: Left;  CDE: 11.25   CORONARY ATHERECTOMY N/A 12/24/2020   Procedure: CORONARY ATHERECTOMY;  Surgeon: Burnell Blanks, MD;  Location: [place] [facility];  Service: Cardiovascular;  Laterality: N/A;   CORONARY STENT INTERVENTION N/A 12/24/2020   Procedure: CORONARY STENT INTERVENTION;  Surgeon: Burnell Blanks, MD;  Location: [place] [facility];  Service: Cardiovascular;  Laterality: N/A;   RIGHT/LEFT HEART CATH AND CORONARY ANGIOGRAPHY N/A 12/17/2020   Procedure: RIGHT/LEFT HEART CATH AND CORONARY ANGIOGRAPHY;  Surgeon: Burnell Blanks, MD;  Location: [place] [facility];  Service: Cardiovascular;  Laterality: N/A;    Current Medications: No outpatient medications have been marked as taking for the 07/09/21 encounter (Appointment) with Donato Heinz, MD.     Allergies:   Patient has no known allergies.   Social History   Socioeconomic History   Marital status: Widowed    Spouse name: Not on file   Number of children: Not on file   Years of education: Not on file   Highest education level: Not on file  Occupational History   Not on file  Tobacco Use   Smoking status: Never   Smokeless tobacco: Never  Vaping Use   Vaping Use: Never used  Substance and Sexual Activity   Alcohol use: Never   Drug use: Never   Sexual activity: Not Currently    Birth control/protection: Post-menopausal  Other Topics Concern   Not on file  Social History Narrative   Not on file   Social Determinants  of Health   Financial Resource Strain: Not on file  Food Insecurity: Not on file  Transportation Needs: Not on file  Physical Activity: Not on file  Stress: Not on file  Social Connections: Not on file     Family History: No known history of heart disease in her immediate family.  ROS:   Please see the history of present illness.     All other systems reviewed and are negative.  EKGs/Labs/Other Studies Reviewed:     The following studies were reviewed today:   EKG:  EKG is not ordered today.  The ekg ordered 11/17/2020 shows sinus tachycardia, rate 101, no ST abnormality  Recent Labs: 11/17/2020: ALT 19 11/18/2020: Magnesium 2.2 06/19/2021: BUN 16; Creatinine, Ser 0.88; Hemoglobin 11.6; Platelets 214; Potassium 3.9; Sodium 139  Recent Lipid Panel    Component Value Date/Time   CHOL 108 01/25/2021 0905   TRIG 84 01/25/2021 0905   HDL 44 01/25/2021 0905   CHOLHDL 2.5 01/25/2021 0905   VLDL 17 01/25/2021 0905   LDLCALC 47 01/25/2021 0905    Physical Exam:    VS:  There were no vitals taken for this visit.    Wt Readings from Last 3 Encounters:  06/19/21 132 lb (59.9 kg)  01/22/21 133 lb (60.3 kg)  12/24/20 133 lb (60.3 kg)     GEN:  Well nourished, well developed in no acute distress HEENT: Normal NECK: No JVD; No carotid bruits LYMPHATICS: No lymphadenopathy CARDIAC: RRR, 3/6 systolic murmur RESPIRATORY:  Clear to auscultation without rales, wheezing or rhonchi  ABDOMEN: Soft, non-tender, non-distended MUSCULOSKELETAL:  No edema; No deformity  SKIN: Warm and dry NEUROLOGIC:  Alert and oriented x 3 PSYCHIATRIC:  Normal affect   ASSESSMENT:    No diagnosis found.   PLAN:    CAD: Reported symptoms consistent with typical angina.  LHC/RHC on 12/17/2020 showed proximal to mid LAD 95% stenosis, distal LAD 60%, ostial to proximal LCx 30%, mid LCx 30%, proximal RCA 20%.  LHC showed mild aortic stenosis (mean gradient 8 mmHg).  She underwent successful DES to proximal mid LAD on 12/24/2020.  Denies any anginal symptoms currently -Continue aspirin 81 mg daily, Plavix 75 mg daily -Continue metoprolol 25 mg twice daily  Aortic stenosis: Echocardiogram on 11/18/2020 showed hyperdynamic LV function (EF greater than 75%), intracavitary gradient 14 mmHg, normal RV function, mild to moderate MR, mild to moderate MS, severe MAC, severely calcified aortic valve with moderate to severe AS (read as  moderate to severe AS by mean gradient only 12 mmHg).  LHC showed mild aortic stenosis (mean gradient 8 mmHg).  Will continue to monitor  Hypertension: On amlodipine 10 mg daily, metoprolol 25 mg twice daily and losartan 100 mg daily.  Appears controlled  Hyperlipidemia: LDL 144 11/19/2020, Crestor dose increased to 20 mg daily.  LDL 47 on 01/25/2021  T2DM: On metformin   RTC in ***   Medication Adjustments/Labs and Tests Ordered: Current medicines are reviewed at length with the patient today.  Concerns regarding medicines are outlined above.  No orders of the defined types were placed in this encounter.  No orders of the defined types were placed in this encounter.    There are no Patient Instructions on file for this visit.   Signed, Donato Heinz, MD  07/05/2021 2:42 PM    [facility]

## 2021-07-09 ENCOUNTER — Encounter: Payer: Self-pay | Admitting: Cardiology

## 2021-07-09 ENCOUNTER — Ambulatory Visit: Payer: Medicare HMO | Admitting: Cardiology

## 2021-07-09 VITALS — BP 178/88 | HR 76 | Ht 63.0 in | Wt 130.8 lb

## 2021-07-09 DIAGNOSIS — I10 Essential (primary) hypertension: Secondary | ICD-10-CM

## 2021-07-09 DIAGNOSIS — E785 Hyperlipidemia, unspecified: Secondary | ICD-10-CM | POA: Diagnosis not present

## 2021-07-09 DIAGNOSIS — I2510 Atherosclerotic heart disease of native coronary artery without angina pectoris: Secondary | ICD-10-CM

## 2021-07-09 DIAGNOSIS — I350 Nonrheumatic aortic (valve) stenosis: Secondary | ICD-10-CM | POA: Diagnosis not present

## 2021-07-09 MED ORDER — AMLODIPINE BESYLATE 5 MG PO TABS: 5.0000 mg | ORAL_TABLET | Freq: Every day | ORAL | 1 refills | Status: DC

## 2021-08-02 DIAGNOSIS — E7801 Familial hypercholesterolemia: Secondary | ICD-10-CM | POA: Diagnosis not present

## 2021-08-02 DIAGNOSIS — Z6825 Body mass index (BMI) 25.0-25.9, adult: Secondary | ICD-10-CM | POA: Diagnosis not present

## 2021-08-02 DIAGNOSIS — I350 Nonrheumatic aortic (valve) stenosis: Secondary | ICD-10-CM | POA: Diagnosis not present

## 2021-08-02 DIAGNOSIS — I2510 Atherosclerotic heart disease of native coronary artery without angina pectoris: Secondary | ICD-10-CM | POA: Diagnosis not present

## 2021-08-02 DIAGNOSIS — I10 Essential (primary) hypertension: Secondary | ICD-10-CM | POA: Diagnosis not present

## 2021-08-02 DIAGNOSIS — E1169 Type 2 diabetes mellitus with other specified complication: Secondary | ICD-10-CM | POA: Diagnosis not present

## 2021-09-06 DIAGNOSIS — E119 Type 2 diabetes mellitus without complications: Secondary | ICD-10-CM | POA: Diagnosis not present

## 2021-10-25 DIAGNOSIS — Z23 Encounter for immunization: Secondary | ICD-10-CM | POA: Diagnosis not present

## 2021-12-13 DIAGNOSIS — Z23 Encounter for immunization: Secondary | ICD-10-CM | POA: Diagnosis not present

## 2021-12-25 ENCOUNTER — Other Ambulatory Visit: Payer: Self-pay | Admitting: Cardiology

## 2022-01-03 ENCOUNTER — Ambulatory Visit (HOSPITAL_COMMUNITY)
Admission: RE | Admit: 2022-01-03 | Discharge: 2022-01-03 | Disposition: A | Discharge status: Home / Self Care | Payer: Medicare HMO | Source: Ambulatory Visit | Attending: Cardiology | Admitting: Cardiology

## 2022-01-03 DIAGNOSIS — I350 Nonrheumatic aortic (valve) stenosis: Secondary | ICD-10-CM | POA: Diagnosis not present

## 2022-01-03 LAB — ECHOCARDIOGRAM COMPLETE
AR max vel: 1.95 cm2
AV Area VTI: 1.93 cm2
AV Area mean vel: 1.91 cm2
AV Mean grad: 22 mmHg
AV Peak grad: 36.5 mmHg
Ao pk vel: 3.02 m/s
Area-P 1/2: 1.13 cm2
MV VTI: 4.24 cm2
S' Lateral: 2 cm

## 2022-01-03 NOTE — Progress Notes (Signed)
*  PRELIMINARY RESULTS* Echocardiogram 2D Echocardiogram has been performed.  Stacey Drain 01/03/2022, 12:29 PM

## 2022-01-24 ENCOUNTER — Ambulatory Visit: Payer: Medicare HMO | Attending: Internal Medicine | Admitting: Internal Medicine

## 2022-01-24 ENCOUNTER — Ambulatory Visit: Payer: Medicare HMO | Admitting: Internal Medicine

## 2022-01-24 ENCOUNTER — Encounter: Payer: Self-pay | Admitting: Internal Medicine

## 2022-01-24 VITALS — BP 150/90 | HR 71 | Ht 63.0 in | Wt 134.4 lb

## 2022-01-24 DIAGNOSIS — E7849 Other hyperlipidemia: Secondary | ICD-10-CM

## 2022-01-24 DIAGNOSIS — E785 Hyperlipidemia, unspecified: Secondary | ICD-10-CM | POA: Insufficient documentation

## 2022-01-24 DIAGNOSIS — I350 Nonrheumatic aortic (valve) stenosis: Secondary | ICD-10-CM | POA: Diagnosis not present

## 2022-01-24 DIAGNOSIS — I10 Essential (primary) hypertension: Secondary | ICD-10-CM

## 2022-01-24 NOTE — Patient Instructions (Signed)
Medication Instructions:  Your physician recommends that you continue on your current medications as directed. Please refer to the Current Medication list given to you today.  StopTaking Plavix   *If you need a refill on your cardiac medications before your next appointment, please call your pharmacy*   Lab Work: NONE   If you have labs (blood work) drawn today and your tests are completely normal, you will receive your results only by: [place] (if you have MyChart) OR A paper copy in the mail If you have any lab test that is abnormal or we need to change your treatment, we will call you to review the results.   Testing/Procedures: Your physician has requested that you have an echocardiogram. Echocardiography is a painless test that uses sound waves to create images of your heart. It provides your doctor with information about the size and shape of your heart and how well your heart's chambers and valves are working. This procedure takes approximately one hour. There are no restrictions for this procedure. Please do NOT wear cologne, perfume, aftershave, or lotions (deodorant is allowed). Please arrive 15 minutes prior to your appointment time.    Follow-Up: At [facility], you and your health needs are our priority.  As part of our continuing mission to provide you with exceptional heart care, we have created designated Provider Care Teams.  These Care Teams include your primary Cardiologist (physician) and Advanced Practice Providers (APPs -  Physician Assistants and Nurse Practitioners) who all work together to provide you with the care you need, when you need it.  We recommend signing up for the patient portal called "MyChart".  Sign up information is provided on this After Visit Summary.  MyChart is used to connect with patients for Virtual Visits (Telemedicine).  Patients are able to view lab/test results, encounter notes, upcoming appointments, etc.  Non-urgent  messages can be sent to your provider as well.   To learn more about what you can do with MyChart, go to [email or web address].    Your next appointment:   6 month(s)  The format for your next appointment:   In Person  Provider:   Claudina Lick, MD    Other Instructions Thank you for choosing [place]!    Important Information About Sugar

## 2022-01-24 NOTE — Progress Notes (Signed)
Cardiology Office Note  Date: 01/24/2022   ID: Kelly Clarke, DOB [birth year], MRN [id number]  PCP:  Leipsic Nation, MD  Cardiologist:  Donato Heinz, MD Electrophysiologist:  None   Reason for Office Visit: Follow-up of CAD and valvular heart disease   History of Present Illness: Kelly Clarke is a 82 y.o. 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  Patient was admitted to [facility] in 11/2020 with flu. Troponin greater than 600. Echocardiogram showed hyperdynamic LV function, intracavitary gradient 40 mmHg, mild to moderate MR, mild to moderate MS, severe MAC, severely calcified aortic valve with moderate to severe aortic stenosis (mean gradient only 12 mmHg, V-max 2.3 m/s).  She subsequently underwent LHC/RHC on 12/17/2020 that showed proximal to mid LAD 95% stenosis, distal LAD 60%, ostial to proximal LCx 30%, mid LCx 30%, proximal RCA 20%. LHC showed mild aortic valve stenosis (mean gradient 8 mmHg). She underwent successful DES to proximal to mid LAD on 12/24/2020. Echocardiogram in 12/2021 showed mild mitral stenosis, mild to moderate MR and moderate aortic valve stenosis. Although she did have severe restriction of mitral and aortic valve leaflets wondering if she had moderate mitral stenosis or moderate to severe aortic valve stenosis. She presented to the cardiology clinic for follow-up visit. She has been on DAPT, aspirin and Plavix therapy for 1 year, uninterrupted. No bleeding complications. Denied having any symptoms of angina, DOE (can perform her ADLs with no SOB, can walk 1-2 blocks at her own pace and can climb 1 flight of stairs with no SOB), denied dizziness/lightness, syncope, palpitations, leg swelling. Denies smoking cigarettes, alcohol use and illicit drug abuse.  Past Medical History:   Diagnosis Date   Diabetes mellitus without complication ([place])    History of kidney stones    Hypertension     Past Surgical History:  Procedure Laterality Date   CATARACT EXTRACTION W/PHACO Right 02/26/2018   Procedure: CATARACT EXTRACTION PHACO AND INTRAOCULAR LENS PLACEMENT RIGHT EYE (CDE: 9.94);  Surgeon: Baruch Goldmann, MD;  Location: [facility];  Service: Ophthalmology;  Laterality: Right;   CATARACT EXTRACTION W/PHACO Left 03/12/2018   Procedure: CATARACT EXTRACTION PHACO AND INTRAOCULAR LENS PLACEMENT (IOC);  Surgeon: Baruch Goldmann, MD;  Location: [facility];  Service: Ophthalmology;  Laterality: Left;  CDE: 11.25   CORONARY ATHERECTOMY N/A 12/24/2020   Procedure: CORONARY ATHERECTOMY;  Surgeon: Burnell Blanks, MD;  Location: [place] [facility];  Service: Cardiovascular;  Laterality: N/A;   CORONARY STENT INTERVENTION N/A 12/24/2020   Procedure: CORONARY STENT INTERVENTION;  Surgeon: Burnell Blanks, MD;  Location: [place] [facility];  Service: Cardiovascular;  Laterality: N/A;   RIGHT/LEFT HEART CATH AND CORONARY ANGIOGRAPHY N/A 12/17/2020   Procedure: RIGHT/LEFT HEART CATH AND CORONARY ANGIOGRAPHY;  Surgeon: Burnell Blanks, MD;  Location: [place] [facility];  Service: Cardiovascular;  Laterality: N/A;    Current Outpatient Medications  Medication Sig Dispense Refill   amLODipine (NORVASC) 5 MG tablet Take 1 tablet by mouth once daily 90 tablet 0   Aromatic Inhalants (VICKS VAPOINHALER IN) Inhale 1 puff into the lungs daily as needed (congestion).     aspirin 81 MG chewable tablet Chew 1 tablet (81 mg total) by mouth daily. 90 tablet 2   clopidogrel (PLAVIX) 75 MG tablet Take 1 tablet (75 mg total) by mouth  daily. 90 tablet 3   losartan (COZAAR) 100 MG tablet Take 100 mg by mouth daily.     metFORMIN (GLUCOPHAGE-XR) 500 MG 24 hr tablet Take 500 mg by mouth daily.     metoprolol tartrate (LOPRESSOR) 25 MG tablet Take 1 tablet (25 mg total) by mouth 2 (two) times  daily. 180 tablet 0   Multiple Vitamin (MULTIVITAMIN WITH MINERALS) TABS tablet Take 1 tablet by mouth daily.     rosuvastatin (CRESTOR) 40 MG tablet Take 1 tablet (40 mg total) by mouth daily. 90 tablet 1   doxycycline (VIBRAMYCIN) 100 MG capsule Take 1 capsule (100 mg total) by mouth 2 (two) times daily. (Patient not taking: Reported on 01/24/2022) 20 capsule 0   No current facility-administered medications for this visit.   Allergies:  Patient has no known allergies.   Social History: The patient  reports that she has never smoked. She has never used smokeless tobacco. She reports that she does not drink alcohol and does not use drugs.   Family History: The patient's family history is not on file.   ROS:  Please see the history of present illness. Otherwise, complete review of systems is positive for none.  All other systems are reviewed and negative.   Physical Exam: VS:  BP (!) 150/90   Pulse 71   Ht _0  (1.6 m)   Wt 134 lb 6.4 oz (61 kg)   SpO2 98%   BMI 23.81 kg/m , BMI Body mass index is 23.81 kg/m.  Wt Readings from Last 3 Encounters:  01/24/22 134 lb 6.4 oz (61 kg)  07/09/21 130 lb 12.8 oz (59.3 kg)  06/19/21 132 lb (59.9 kg)    General: Patient appears comfortable at rest. HEENT: Conjunctiva and lids normal, oropharynx clear with moist mucosa. Neck: Supple, no elevated JVP or carotid bruits, no thyromegaly. Lungs: Clear to auscultation, nonlabored breathing at rest. Cardiac: Regular rate and rhythm, holosystolic murmur present Abdomen: Soft, nontender, no hepatomegaly, bowel sounds present, no guarding or rebound. Extremities: No pitting edema, distal pulses 2+. Skin: Warm and dry. Musculoskeletal: No kyphosis. Neuropsychiatric: Alert and oriented x3, affect grossly appropriate.  ECG: Normal sinus rhythm with no ST-T changes  Recent Labwork: 06/19/2021: BUN 16; Creatinine, Ser 0.88; Hemoglobin 11.6; Platelets 214; Potassium 3.9; Sodium 139     Component Value  Date/Time   CHOL 108 01/25/2021 0905   TRIG 84 01/25/2021 0905   HDL 44 01/25/2021 0905   CHOLHDL 2.5 01/25/2021 0905   VLDL 17 01/25/2021 0905   LDLCALC 47 01/25/2021 0905    Other Studies Reviewed Today: Echo from 12/2021 LVEF 60 to 65% G1 DD Intracavitary gradient of 25 mmHg RV systolic function is normal LA severely dilated Mild to moderate MR, mild MS Moderate aortic stenosis  Assessment and Plan: Patient is 82 year old F known to have CAD s/p LAD PCI in 12/2020 with normal LVEF, 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  # CAD s/p LAD PCI in 12/2020 with normal LVEF -Patient has been on DAPT (aspirin plus Plavix) from 12/2020 till date. Continue Aspir 81 milligram once daily and stop clopidogrel. -Continue rosuvastatin 40 mg nightly -ER precautions for chest pain  # HLD, at goal -Continue rosuvastatin 40 mg p.o. at bedtime. LDL 1 year ago was 47. Instructed patient to follow-up with PCP for obtaining lipid panel. She has an upcoming appointment with her PCP later  this month.  Goal LDL less than 70.  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 But LHC/RHC from 12/2020 showed mild aortic valve stenosis. Echo from 12/2021 showed moderate aortic valve stenosis with mean gradient of 22 mmHg and V-max 3 m/s. Patient is currently asymptomatic. 2D echocardiogram in 6 months prior to the next clinic visit.  # HTN, controlled -Continue amlodipine 5 mg once daily, losartan 100 mg once daily and metoprolol tartrate 25 mg twice daily.  Medication Adjustments/Labs and Tests Ordered: Current medicines are reviewed at length with the patient today.  Concerns regarding medicines are outlined above.   Tests Ordered: Orders Placed This Encounter   Procedures   EKG 12-Lead    Medication Changes: No orders of the defined types were placed in this encounter.   Disposition:  Follow up  6 months  Signed,  Fidel Levy, MD, 01/24/2022 8:44 AM    [facility] at [facility] [address], [place], [place] [address]

## 2022-02-07 DIAGNOSIS — E7801 Familial hypercholesterolemia: Secondary | ICD-10-CM | POA: Diagnosis not present

## 2022-02-07 DIAGNOSIS — E119 Type 2 diabetes mellitus without complications: Secondary | ICD-10-CM | POA: Diagnosis not present

## 2022-02-07 DIAGNOSIS — E7849 Other hyperlipidemia: Secondary | ICD-10-CM | POA: Diagnosis not present

## 2022-02-07 DIAGNOSIS — E559 Vitamin D deficiency, unspecified: Secondary | ICD-10-CM | POA: Diagnosis not present

## 2022-02-07 DIAGNOSIS — I10 Essential (primary) hypertension: Secondary | ICD-10-CM | POA: Diagnosis not present

## 2022-02-21 DIAGNOSIS — I350 Nonrheumatic aortic (valve) stenosis: Secondary | ICD-10-CM | POA: Diagnosis not present

## 2022-02-21 DIAGNOSIS — E7801 Familial hypercholesterolemia: Secondary | ICD-10-CM | POA: Diagnosis not present

## 2022-02-21 DIAGNOSIS — I10 Essential (primary) hypertension: Secondary | ICD-10-CM | POA: Diagnosis not present

## 2022-02-21 DIAGNOSIS — Z6825 Body mass index (BMI) 25.0-25.9, adult: Secondary | ICD-10-CM | POA: Diagnosis not present

## 2022-02-21 DIAGNOSIS — I2510 Atherosclerotic heart disease of native coronary artery without angina pectoris: Secondary | ICD-10-CM | POA: Diagnosis not present

## 2022-02-21 DIAGNOSIS — E1169 Type 2 diabetes mellitus with other specified complication: Secondary | ICD-10-CM | POA: Diagnosis not present

## 2022-03-26 ENCOUNTER — Other Ambulatory Visit: Payer: Self-pay | Admitting: Cardiology

## 2022-05-16 DIAGNOSIS — Z0001 Encounter for general adult medical examination with abnormal findings: Secondary | ICD-10-CM | POA: Diagnosis not present

## 2022-05-16 DIAGNOSIS — E7801 Familial hypercholesterolemia: Secondary | ICD-10-CM | POA: Diagnosis not present

## 2022-05-16 DIAGNOSIS — E1169 Type 2 diabetes mellitus with other specified complication: Secondary | ICD-10-CM | POA: Diagnosis not present

## 2022-05-16 DIAGNOSIS — M419 Scoliosis, unspecified: Secondary | ICD-10-CM | POA: Diagnosis not present

## 2022-05-16 DIAGNOSIS — Z1329 Encounter for screening for other suspected endocrine disorder: Secondary | ICD-10-CM | POA: Diagnosis not present

## 2022-05-16 DIAGNOSIS — E7800 Pure hypercholesterolemia, unspecified: Secondary | ICD-10-CM | POA: Diagnosis not present

## 2022-05-16 DIAGNOSIS — E7849 Other hyperlipidemia: Secondary | ICD-10-CM | POA: Diagnosis not present

## 2022-05-16 DIAGNOSIS — I10 Essential (primary) hypertension: Secondary | ICD-10-CM | POA: Diagnosis not present

## 2022-05-16 DIAGNOSIS — E559 Vitamin D deficiency, unspecified: Secondary | ICD-10-CM | POA: Diagnosis not present

## 2022-05-16 DIAGNOSIS — E782 Mixed hyperlipidemia: Secondary | ICD-10-CM | POA: Diagnosis not present

## 2022-05-23 DIAGNOSIS — I10 Essential (primary) hypertension: Secondary | ICD-10-CM | POA: Diagnosis not present

## 2022-05-23 DIAGNOSIS — Z0001 Encounter for general adult medical examination with abnormal findings: Secondary | ICD-10-CM | POA: Diagnosis not present

## 2022-05-23 DIAGNOSIS — I2510 Atherosclerotic heart disease of native coronary artery without angina pectoris: Secondary | ICD-10-CM | POA: Diagnosis not present

## 2022-05-23 DIAGNOSIS — E1169 Type 2 diabetes mellitus with other specified complication: Secondary | ICD-10-CM | POA: Diagnosis not present

## 2022-05-23 DIAGNOSIS — E7801 Familial hypercholesterolemia: Secondary | ICD-10-CM | POA: Diagnosis not present

## 2022-05-23 DIAGNOSIS — I350 Nonrheumatic aortic (valve) stenosis: Secondary | ICD-10-CM | POA: Diagnosis not present

## 2022-05-23 DIAGNOSIS — Z6825 Body mass index (BMI) 25.0-25.9, adult: Secondary | ICD-10-CM | POA: Diagnosis not present

## 2022-06-08 DIAGNOSIS — N182 Chronic kidney disease, stage 2 (mild): Secondary | ICD-10-CM | POA: Diagnosis not present

## 2022-06-08 DIAGNOSIS — I951 Orthostatic hypotension: Secondary | ICD-10-CM | POA: Diagnosis not present

## 2022-06-08 DIAGNOSIS — M810 Age-related osteoporosis without current pathological fracture: Secondary | ICD-10-CM | POA: Diagnosis not present

## 2022-06-08 DIAGNOSIS — Z809 Family history of malignant neoplasm, unspecified: Secondary | ICD-10-CM | POA: Diagnosis not present

## 2022-06-08 DIAGNOSIS — I2510 Atherosclerotic heart disease of native coronary artery without angina pectoris: Secondary | ICD-10-CM | POA: Diagnosis not present

## 2022-06-08 DIAGNOSIS — Z7984 Long term (current) use of oral hypoglycemic drugs: Secondary | ICD-10-CM | POA: Diagnosis not present

## 2022-06-08 DIAGNOSIS — E785 Hyperlipidemia, unspecified: Secondary | ICD-10-CM | POA: Diagnosis not present

## 2022-06-08 DIAGNOSIS — E1151 Type 2 diabetes mellitus with diabetic peripheral angiopathy without gangrene: Secondary | ICD-10-CM | POA: Diagnosis not present

## 2022-06-08 DIAGNOSIS — D6869 Other thrombophilia: Secondary | ICD-10-CM | POA: Diagnosis not present

## 2022-06-08 DIAGNOSIS — Z008 Encounter for other general examination: Secondary | ICD-10-CM | POA: Diagnosis not present

## 2022-06-08 DIAGNOSIS — I129 Hypertensive chronic kidney disease with stage 1 through stage 4 chronic kidney disease, or unspecified chronic kidney disease: Secondary | ICD-10-CM | POA: Diagnosis not present

## 2022-06-08 DIAGNOSIS — Z8249 Family history of ischemic heart disease and other diseases of the circulatory system: Secondary | ICD-10-CM | POA: Diagnosis not present

## 2022-06-08 DIAGNOSIS — E1122 Type 2 diabetes mellitus with diabetic chronic kidney disease: Secondary | ICD-10-CM | POA: Diagnosis not present

## 2022-06-27 DIAGNOSIS — R030 Elevated blood-pressure reading, without diagnosis of hypertension: Secondary | ICD-10-CM | POA: Diagnosis not present

## 2022-06-27 DIAGNOSIS — Z6825 Body mass index (BMI) 25.0-25.9, adult: Secondary | ICD-10-CM | POA: Diagnosis not present

## 2022-06-27 DIAGNOSIS — I350 Nonrheumatic aortic (valve) stenosis: Secondary | ICD-10-CM | POA: Diagnosis not present

## 2022-06-27 DIAGNOSIS — I739 Peripheral vascular disease, unspecified: Secondary | ICD-10-CM | POA: Diagnosis not present

## 2022-07-22 DIAGNOSIS — I739 Peripheral vascular disease, unspecified: Secondary | ICD-10-CM | POA: Diagnosis not present

## 2022-08-01 ENCOUNTER — Ambulatory Visit (HOSPITAL_COMMUNITY)
Admission: RE | Admit: 2022-08-01 | Discharge: 2022-08-01 | Disposition: A | Discharge status: Home / Self Care | Payer: Medicare HMO | Source: Ambulatory Visit | Attending: Internal Medicine | Admitting: Internal Medicine

## 2022-08-01 DIAGNOSIS — I517 Cardiomegaly: Secondary | ICD-10-CM | POA: Diagnosis not present

## 2022-08-01 DIAGNOSIS — I5030 Unspecified diastolic (congestive) heart failure: Secondary | ICD-10-CM | POA: Diagnosis not present

## 2022-08-01 DIAGNOSIS — I080 Rheumatic disorders of both mitral and aortic valves: Secondary | ICD-10-CM

## 2022-08-01 DIAGNOSIS — I350 Nonrheumatic aortic (valve) stenosis: Secondary | ICD-10-CM

## 2022-08-01 LAB — ECHOCARDIOGRAM COMPLETE
AR max vel: 1.26 cm2
AV Area VTI: 1.36 cm2
AV Area mean vel: 1.16 cm2
AV Mean grad: 15 mmHg
AV Peak grad: 23.7 mmHg
Ao pk vel: 2.43 m/s
Area-P 1/2: 2.07 cm2
MV M vel: 2.28 m/s
MV Peak grad: 20.7 mmHg
MV VTI: 1.68 cm2
S' Lateral: 1.7 cm

## 2022-08-01 NOTE — Progress Notes (Signed)
  Echocardiogram 2D Echocardiogram has been performed.  Kelly Clarke 08/01/2022, 9:30 AM

## 2022-08-03 ENCOUNTER — Ambulatory Visit: Payer: Medicare HMO | Admitting: Internal Medicine

## 2022-08-08 ENCOUNTER — Ambulatory Visit: Payer: Medicare HMO | Admitting: Internal Medicine

## 2022-08-15 NOTE — Progress Notes (Signed)
Cardiology Office Note:  .   Date:  08/29/2022  ID:  Kelly Clarke, DOB 11-03-1940, MRN [id number] PCP: Kelly Potts, MD   [facility] Providers Cardiologist:  Marjo Bicker, MD    History of Present Illness: .   Kelly Clarke is a 82 y.o. 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  Patient comes in for f/u. Denies chest pain, palpitations, dyspnea, edema. She walks around her yard for 10 min 2-3 days a week and stretches inside.   ROS:    Studies Reviewed: Marland Kitchen         Prior CV Studies:   Echo 07/2022 IMPRESSIONS     1. Left ventricular ejection fraction, by estimation, is 60 to 65%. The  left ventricle has normal function. The left ventricle has no regional  wall motion abnormalities. There is severe asymmetric left ventricular  hypertrophy of the basal and septal  segments. Left ventricular diastolic parameters are consistent with Grade  I diastolic dysfunction (impaired relaxation).   2. Right ventricular systolic function is normal. The right ventricular  size is normal. There is normal pulmonary artery systolic pressure.   3. Left atrial size was severely dilated.   4. A small pericardial effusion is present. The pericardial effusion is  circumferential.   5. The mitral valve is abnormal. Mild mitral valve regurgitation. Mild  mitral stenosis. The mean mitral valve gradient is 4.0 mmHg. Severe mitral  annular calcification.   6. The aortic valve has an indeterminant number of cusps. There is severe  calcifcation of the aortic valve. Aortic valve regurgitation is not  visualized. Mild to moderate aortic valve stenosis. Aortic valve area, by  VTI measures 1.36 cm. Aortic valve  mean gradient measures 15.0 mmHg. Aortic valve Vmax measures 2.43 m/s.   7. The inferior vena cava is normal in size with greater than 50%  respiratory variability, suggesting right atrial  pressure of 3 mmHg.   Comparison(s): No significant change from prior study.  LHC/RHC on 12/17/2020 that showed proximal to mid LAD 95% stenosis, distal LAD 60%, ostial to proximal LCx 30%, mid LCx 30%, proximal RCA 20%. LHC showed mild aortic valve stenosis (mean gradient 8 mmHg). She underwent successful DES to proximal to mid LAD on 12/24/2020. Echocardiogram in 12/2021 showed mild mitral stenosis, mild to moderate MR and moderate aortic valve stenosis. Although she did have severe restriction of mitral and aortic valve leaflets wondering if she had moderate mitral stenosis or moderate to severe aortic valve stenosis  Risk Assessment/Calculations:             Physical Exam:   VS:  BP 136/70   Pulse 68   Ht 5\' 3"  (1.6 m)   Wt 135 lb (61.2 kg)   SpO2 97%   BMI 23.91 kg/m    Wt Readings from Last 3 Encounters:  08/29/22 135 lb (61.2 kg)  01/24/22 134 lb 6.4 oz (61 kg)  07/09/21 130 lb 12.8 oz (59.3 kg)    GEN: Well nourished, well developed in no acute distress NECK: No JVD; No carotid bruits CARDIAC:  RRR, 3-4/6 systolic murmur LSB RESPIRATORY:  Clear to auscultation without rales, wheezing or rhonchi  ABDOMEN: Soft, non-tender, non-distended EXTREMITIES:  No edema; No deformity   ASSESSMENT AND PLAN: .   CAD S/P LAD PCI 12/2020 normal LVEF on ASA and rosuvastatin -no angina  HLD-no recent FLP, will  discuss with PCP next week.   Moderate AS/mild to mod MR-Echo 07/2022 unchanged.  HTN-well controlled. PCP increased amlodipine 10 mg daily        Dispo: f/u in 6 months  Signed, Jacolyn Reedy, PA-C

## 2022-08-22 DIAGNOSIS — E7849 Other hyperlipidemia: Secondary | ICD-10-CM | POA: Diagnosis not present

## 2022-08-22 DIAGNOSIS — E7801 Familial hypercholesterolemia: Secondary | ICD-10-CM | POA: Diagnosis not present

## 2022-08-22 DIAGNOSIS — E1169 Type 2 diabetes mellitus with other specified complication: Secondary | ICD-10-CM | POA: Diagnosis not present

## 2022-08-22 DIAGNOSIS — Z1329 Encounter for screening for other suspected endocrine disorder: Secondary | ICD-10-CM | POA: Diagnosis not present

## 2022-08-22 DIAGNOSIS — I10 Essential (primary) hypertension: Secondary | ICD-10-CM | POA: Diagnosis not present

## 2022-08-23 DIAGNOSIS — E1169 Type 2 diabetes mellitus with other specified complication: Secondary | ICD-10-CM | POA: Diagnosis not present

## 2022-08-23 DIAGNOSIS — I10 Essential (primary) hypertension: Secondary | ICD-10-CM | POA: Diagnosis not present

## 2022-08-29 ENCOUNTER — Encounter: Payer: Self-pay | Admitting: Physician Assistant

## 2022-08-29 ENCOUNTER — Ambulatory Visit: Payer: Medicare HMO | Attending: Internal Medicine | Admitting: Physician Assistant

## 2022-08-29 VITALS — BP 136/70 | HR 68 | Ht 63.0 in | Wt 135.0 lb

## 2022-08-29 DIAGNOSIS — I10 Essential (primary) hypertension: Secondary | ICD-10-CM | POA: Diagnosis not present

## 2022-08-29 DIAGNOSIS — I350 Nonrheumatic aortic (valve) stenosis: Secondary | ICD-10-CM | POA: Diagnosis not present

## 2022-08-29 DIAGNOSIS — I2510 Atherosclerotic heart disease of native coronary artery without angina pectoris: Secondary | ICD-10-CM | POA: Diagnosis not present

## 2022-08-29 DIAGNOSIS — E785 Hyperlipidemia, unspecified: Secondary | ICD-10-CM

## 2022-08-29 NOTE — Patient Instructions (Signed)
Medication Instructions:  Your physician recommends that you continue on your current medications as directed. Please refer to the Current Medication list given to you today.   Labwork: None today  Testing/Procedures: None today  Follow-Up: 6 months Dr.Mallipeddi  Any Other Special Instructions Will Be Listed Below (If Applicable).  If you need a refill on your cardiac medications before your next appointment, please call your pharmacy.

## 2022-09-05 DIAGNOSIS — E1169 Type 2 diabetes mellitus with other specified complication: Secondary | ICD-10-CM | POA: Diagnosis not present

## 2022-09-05 DIAGNOSIS — I350 Nonrheumatic aortic (valve) stenosis: Secondary | ICD-10-CM | POA: Diagnosis not present

## 2022-09-05 DIAGNOSIS — E7801 Familial hypercholesterolemia: Secondary | ICD-10-CM | POA: Diagnosis not present

## 2022-09-05 DIAGNOSIS — I739 Peripheral vascular disease, unspecified: Secondary | ICD-10-CM | POA: Diagnosis not present

## 2022-09-05 DIAGNOSIS — Z6825 Body mass index (BMI) 25.0-25.9, adult: Secondary | ICD-10-CM | POA: Diagnosis not present

## 2022-09-05 DIAGNOSIS — I10 Essential (primary) hypertension: Secondary | ICD-10-CM | POA: Diagnosis not present

## 2022-09-05 DIAGNOSIS — I2510 Atherosclerotic heart disease of native coronary artery without angina pectoris: Secondary | ICD-10-CM | POA: Diagnosis not present

## 2022-09-12 DIAGNOSIS — E119 Type 2 diabetes mellitus without complications: Secondary | ICD-10-CM | POA: Diagnosis not present

## 2022-10-17 DIAGNOSIS — Z23 Encounter for immunization: Secondary | ICD-10-CM | POA: Diagnosis not present

## 2022-12-03 IMAGING — DX DG CHEST 1V PORT
1 series · 1 of 1 positions shown · non-contrast
Comparison: None.

CLINICAL DATA: Cough.  Fever and weakness for 2 days.

EXAM:
PORTABLE CHEST 1 VIEW

[chest ap]
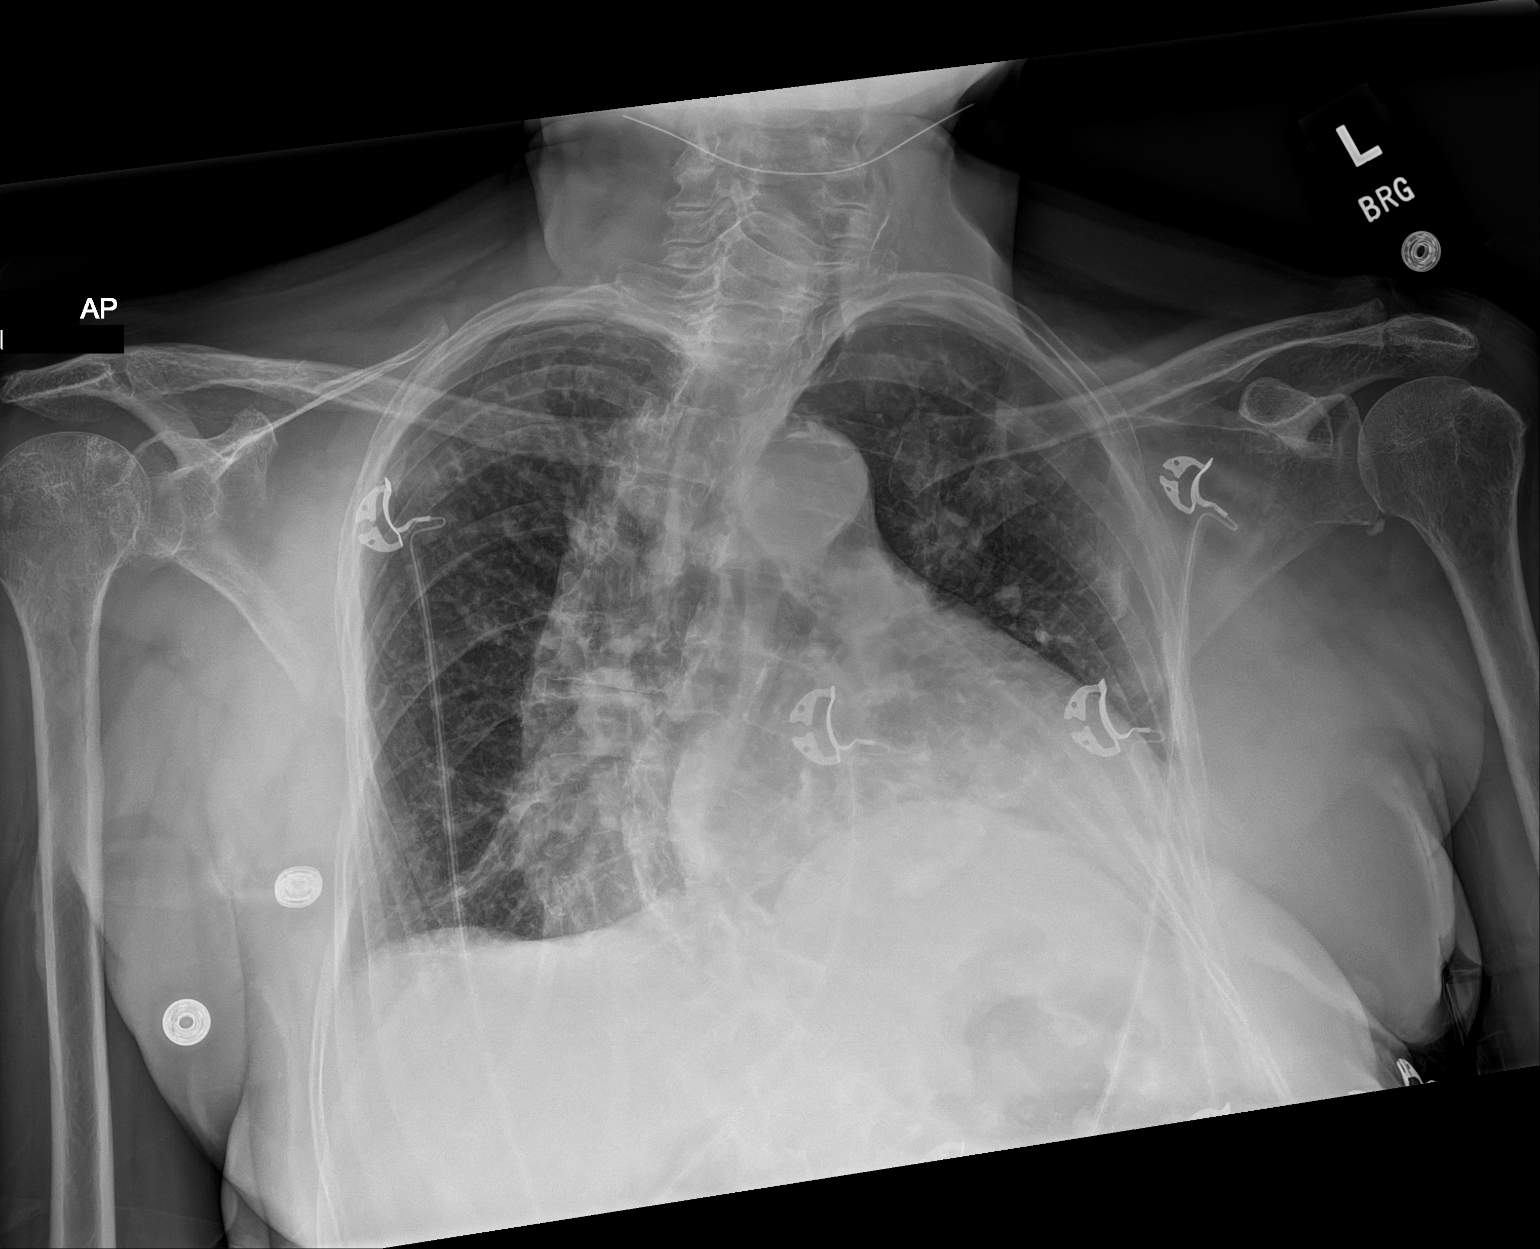

[1 of 1 positions shown; findings below may reference images not displayed]

FINDINGS: Cardiac silhouette is mildly enlarged. No mediastinal or hilar
masses. Dense atherosclerotic calcification courses along the
thoracic aorta.

Mild linear opacities in the lung bases consistent with atelectasis
or scarring. Remainder of the lungs is clear.

No convincing pleural effusion and no pneumothorax.

Skeletal structures are demineralized. Prominent dextroscoliosis of
the thoracic spine.
IMPRESSION: No acute cardiopulmonary disease.

## 2023-03-13 DIAGNOSIS — R7989 Other specified abnormal findings of blood chemistry: Secondary | ICD-10-CM | POA: Diagnosis not present

## 2023-03-13 DIAGNOSIS — M542 Cervicalgia: Secondary | ICD-10-CM | POA: Diagnosis not present

## 2023-03-20 ENCOUNTER — Ambulatory Visit: Payer: Medicare HMO | Admitting: Internal Medicine

## 2023-03-30 ENCOUNTER — Ambulatory Visit: Payer: Medicare HMO | Attending: Internal Medicine | Admitting: Internal Medicine

## 2023-03-30 ENCOUNTER — Encounter: Payer: Self-pay | Admitting: Internal Medicine

## 2023-03-30 VITALS — BP 138/74 | HR 70 | Ht 61.0 in | Wt 130.2 lb

## 2023-03-30 DIAGNOSIS — I2510 Atherosclerotic heart disease of native coronary artery without angina pectoris: Secondary | ICD-10-CM

## 2023-03-30 DIAGNOSIS — I342 Nonrheumatic mitral (valve) stenosis: Secondary | ICD-10-CM

## 2023-03-30 DIAGNOSIS — I350 Nonrheumatic aortic (valve) stenosis: Secondary | ICD-10-CM | POA: Diagnosis not present

## 2023-03-30 DIAGNOSIS — I050 Rheumatic mitral stenosis: Secondary | ICD-10-CM | POA: Insufficient documentation

## 2023-03-30 NOTE — Patient Instructions (Signed)
 Medication Instructions:  Your physician recommends that you continue on your current medications as directed. Please refer to the Current Medication list given to you today.  *If you need a refill on your cardiac medications before your next appointment, please call your pharmacy*   Lab Work: NONE   If you have labs (blood work) drawn today and your tests are completely normal, you will receive your results only by: MyChart Message (if you have MyChart) OR A paper copy in the mail If you have any lab test that is abnormal or we need to change your treatment, we will call you to review the results.   Testing/Procedures: Your physician has requested that you have an echocardiogram. Echocardiography is a painless test that uses sound waves to create images of your heart. It provides your doctor with information about the size and shape of your heart and how well your heart's chambers and valves are working. This procedure takes approximately one hour. There are no restrictions for this procedure. Please do NOT wear cologne, perfume, aftershave, or lotions (deodorant is allowed). Please arrive 15 minutes prior to your appointment time.  Please note: We ask at that you not bring children with you during ultrasound (echo/ vascular) testing. Due to room size and safety concerns, children are not allowed in the ultrasound rooms during exams. Our front office staff cannot provide observation of children in our lobby area while testing is being conducted. An adult accompanying a patient to their appointment will only be allowed in the ultrasound room at the discretion of the ultrasound technician under special circumstances. We apologize for any inconvenience.    Follow-Up: At [facility], you and your health needs are our priority.  As part of our continuing mission to provide you with exceptional heart care, we have created designated Provider Care Teams.  These Care Teams include your  primary Cardiologist (physician) and Advanced Practice Providers (APPs -  Physician Assistants and Nurse Practitioners) who all work together to provide you with the care you need, when you need it.  We recommend signing up for the patient portal called "MyChart".  Sign up information is provided on this After Visit Summary.  MyChart is used to connect with patients for Virtual Visits (Telemedicine).  Patients are able to view lab/test results, encounter notes, upcoming appointments, etc.  Non-urgent messages can be sent to your provider as well.   To learn more about what you can do with MyChart, go to [email or web address].    Your next appointment:   1 year(s)  Provider:   You may see Vishnu P Mallipeddi, MD or one of the following Advanced Practice Providers on your designated Care Team:   Randall An, PA-C  Jacolyn Reedy, PA-C     Other Instructions Thank you for choosing  [facility]!

## 2023-03-30 NOTE — Progress Notes (Signed)
 Cardiology Office Note  Date: 03/30/2023   ID: Kelly Clarke, DOB 09/29/1940, MRN [id number]  PCP:  Donetta Potts, MD  Cardiologist:  Marjo Bicker, MD Electrophysiologist:  None   Reason for Office Visit: Follow-up of CAD and valvular heart disease   History of Present Illness: Kelly Clarke is a 83 y.o. female known to have CAD s/p LAD PCI in 12/2020 with normal LVEF, valvular heart disease, HTN, DM2, HLD presented to cardiology clinic for follow-up of CAD and valvular heart disease.  Patient was admitted to [facility] in 11/2020 with flu. Troponin greater than 600. Echocardiogram showed hyperdynamic LV function, intracavitary gradient 40 mmHg, mild to moderate MR, mild to moderate MS, severe MAC, severely calcified aortic valve with moderate to severe aortic stenosis (mean gradient only 12 mmHg, V-max 2.3 m/s).  She subsequently underwent LHC/RHC on 12/17/2020 that showed proximal to mid LAD 95% stenosis, distal LAD 60%, ostial to proximal LCx 30%, mid LCx 30%, proximal RCA 20%. LHC showed mild aortic valve stenosis (mean gradient 8 mmHg). She underwent successful DES to proximal to mid LAD on 12/24/2020. Subsequent echocardiograms in 2023 and 2024 showed moderate aortic valve stenosis.  She is here for follow-up visit.  She has new onset of neck/throat pain for the last 1 month.  Her PCP is planning to perform thyroid imaging according to her.  Denies having any angina.  Denies having any shortness of breath except for the fact that she gets out of breath when she was raking leaves.  She says this is new but she is able to perform her household chores with no issues.  No dizziness, lightheadedness, syncope, presyncope, palpitations, leg swelling.  Past Medical History:  Diagnosis Date   Diabetes mellitus without complication ([facility])    History of kidney stones    Hypertension     Past Surgical History:  Procedure Laterality Date   CATARACT EXTRACTION W/PHACO Right 02/26/2018    Procedure: CATARACT EXTRACTION PHACO AND INTRAOCULAR LENS PLACEMENT RIGHT EYE (CDE: 9.94);  Surgeon: Fabio Pierce, MD;  Location: [facility];  Service: Ophthalmology;  Laterality: Right;   CATARACT EXTRACTION W/PHACO Left 03/12/2018   Procedure: CATARACT EXTRACTION PHACO AND INTRAOCULAR LENS PLACEMENT (IOC);  Surgeon: Fabio Pierce, MD;  Location: [facility];  Service: Ophthalmology;  Laterality: Left;  CDE: 11.25   CORONARY ATHERECTOMY N/A 12/24/2020   Procedure: CORONARY ATHERECTOMY;  Surgeon: Kathleene Hazel, MD;  [facility]: [facility];  Service: Cardiovascular;  Laterality: N/A;   CORONARY STENT INTERVENTION N/A 12/24/2020   Procedure: CORONARY STENT INTERVENTION;  Surgeon: Kathleene Hazel, MD;  Location: [facility];  Service: Cardiovascular;  Laterality: N/A;   RIGHT/LEFT HEART CATH AND CORONARY ANGIOGRAPHY N/A 12/17/2020   Procedure: RIGHT/LEFT HEART CATH AND CORONARY ANGIOGRAPHY;  Surgeon: Kathleene Hazel, MD;  Location: [facility];  Service: Cardiovascular;  Laterality: N/A;    Current Outpatient Medications  Medication Sig Dispense Refill   amLODipine (NORVASC) 10 MG tablet Take 10 mg by mouth daily.     Aromatic Inhalants (VICKS VAPOINHALER IN) Inhale 1 puff into the lungs daily as needed (congestion).     aspirin 81 MG chewable tablet Chew 1 tablet (81 mg total) by mouth daily. 90 tablet 2   losartan (COZAAR) 100 MG tablet Take 100 mg by mouth daily.     metFORMIN (GLUCOPHAGE-XR) 500 MG 24 hr tablet Take 500 mg by mouth daily.     metoprolol tartrate (LOPRESSOR) 25 MG tablet Take  1 tablet (25 mg total) by mouth 2 (two) times daily. 180 tablet 0   Multiple Vitamin (MULTIVITAMIN WITH MINERALS) TABS tablet Take 1 tablet by mouth daily.     rosuvastatin (CRESTOR) 40 MG tablet Take 1 tablet (40 mg total) by mouth daily. 90 tablet 1   No current facility-administered medications for this visit.   Allergies:  Patient has no known allergies.    Social History: The patient  reports that she has never smoked. She has never used smokeless tobacco. She reports that she does not drink alcohol and does not use drugs.   Family History: The patient's family history is not on file.   ROS:  Please see the history of present illness. Otherwise, complete review of systems is positive for none.  All other systems are reviewed and negative.   Physical Exam: VS:  BP (!) 158/90   Pulse 70   Ht 5\' 1"  (1.549 m)   Wt 130 lb 3.2 oz (59.1 kg)   SpO2 95%   BMI 24.60 kg/m , BMI Body mass index is 24.6 kg/m.  Wt Readings from Last 3 Encounters:  03/30/23 130 lb 3.2 oz (59.1 kg)  08/29/22 135 lb (61.2 kg)  01/24/22 134 lb 6.4 oz (61 kg)    General: Patient appears comfortable at rest. HEENT: Conjunctiva and lids normal, oropharynx clear with moist mucosa. Neck: Supple, no elevated JVP or carotid bruits, no thyromegaly. Lungs: Clear to auscultation, nonlabored breathing at rest. Cardiac: Regular rate and rhythm, ESM with absent S2 Abdomen: Soft, nontender, no hepatomegaly, bowel sounds present, no guarding or rebound. Extremities: No pitting edema, distal pulses 2+. Skin: Warm and dry. Musculoskeletal: No kyphosis. Neuropsychiatric: Alert and oriented x3, affect grossly appropriate.  ECG: Normal sinus rhythm with no ST-T changes  Recent Labwork: No results found for requested labs within last 365 days.     Component Value Date/Time   CHOL 108 01/25/2021 0905   TRIG 84 01/25/2021 0905   HDL 44 01/25/2021 0905   CHOLHDL 2.5 01/25/2021 0905   VLDL 17 01/25/2021 0905   LDLCALC 47 01/25/2021 0905    Other Studies Reviewed Today: Echo from 12/2021 LVEF 60 to 65% G1 DD Intracavitary gradient of 25 mmHg RV systolic function is normal LA severely dilated Mild to moderate MR, mild MS Moderate aortic stenosis  Assessment and Plan:  # Aortic valve stenosis, likely severe: Prior echocardiograms reported moderate aortic stenosis but  review of echo images showed severe restricted mobility of aortic valve leaflets.  Aortic valve leaflets are severely calcified.  Aortic valve Vmax 2.4 m/s, mean PG 15 mmHg and aortic valve area 1.36 cm.  Normal LVEF, normal SVI. LHC in 2022 showed mild aortic stenosis.  Physical exam today showed ejection systolic murmur with absent S2 consistent with severe aortic valve stenosis.  Will repeat echocardiogram now. EKG today showed NSR, left ventricular strain pattern which is new compared to prior EKGs.  She does not have any symptoms except for some SOB while she was raking leaves. She might benefit from CT TAVR study for aortic valve calcium scoring.  Will place referral to structural heart clinic after echocardiogram is resulted.  # Mild mitral stenosis: Will repeat echocardiogram as stated above.  # CAD s/p LAD PCI in 12/2020 with normal LVEF: Denies having any angina but she has new onset of neck/throat pain for the last 1 month.  Has some SOB while raking leaves but no SOB with household chores.  Clarke to new EKG changes (  ST-T wave abnormality in the inferior and lateral leads), will obtain exercise Myoview after echocardiogram is resulted.  Although these EKG changes are likely secondary to LV strain, CAD cannot be completely ruled out.  Will continue cardioprotective medications, aspirin 81 mg once daily and rosuvastatin 40 mg nightly.  # HLD, at goal: Continue rosuvastatin 40 mg nightly.  Goal LDL less than 70.  # HTN, controlled: Continue current antihypertensives, amlodipine 10 mg once daily, losartan 100 mg once daily and metoprolol tartrate 25 mg twice daily.  Medication Adjustments/Labs and Tests Ordered: Current medicines are reviewed at length with the patient today.  Concerns regarding medicines are outlined above.   Tests Ordered: Orders Placed This Encounter  Procedures   EKG 12-Lead    Medication Changes: No orders of the defined types were placed in this  encounter.   Disposition:  Follow up  6 months  Signed, Julann Mcgilvray Verne Spurr, MD, 03/30/2023 1:24 PM     [facility] at [facility] [address] [place], [place] [address]

## 2023-04-11 ENCOUNTER — Ambulatory Visit (HOSPITAL_COMMUNITY)
Admission: RE | Admit: 2023-04-11 | Discharge: 2023-04-11 | Disposition: A | Discharge status: Home / Self Care | Source: Ambulatory Visit | Attending: Internal Medicine | Admitting: Internal Medicine

## 2023-04-11 DIAGNOSIS — I350 Nonrheumatic aortic (valve) stenosis: Secondary | ICD-10-CM | POA: Diagnosis not present

## 2023-04-11 LAB — ECHOCARDIOGRAM COMPLETE
AR max vel: 1.4 cm2
AV Area VTI: 1.32 cm2
AV Area mean vel: 1.32 cm2
AV Mean grad: 16.5 mmHg
AV Peak grad: 29.8 mmHg
Ao pk vel: 2.73 m/s
Area-P 1/2: 1.93 cm2
Calc EF: 63.4 %
Est EF: >75
MV VTI: 1.71 cm2
S' Lateral: 1.4 cm
Single Plane A2C EF: 55 %
Single Plane A4C EF: 67.6 %

## 2023-04-12 ENCOUNTER — Telehealth: Payer: Self-pay

## 2023-04-12 DIAGNOSIS — I517 Cardiomegaly: Secondary | ICD-10-CM

## 2023-04-12 MED ORDER — METOPROLOL TARTRATE 25 MG PO TABS: 37.5000 mg | ORAL_TABLET | Freq: Two times a day (BID) | ORAL | 3 refills | Status: AC

## 2023-04-12 NOTE — Addendum Note (Signed)
 Addended by: Leonides Schanz C on: 04/12/2023 11:36 AM   Modules accepted: Orders

## 2023-04-12 NOTE — Telephone Encounter (Signed)
-----   Message from Vishnu P Mallipeddi sent at 04/12/2023 10:46 AM EDT ----- Normal pumping function of the heart, G1 DD with elevated LVEDP, CVP 3 mmHg, normal RV function, mild MR with mild MS, mild to moderate AS.  Findings unchanged from prior echocardiogram except for maximum intracavitary gradient of 62 mmHg noted now.  She does have DOE, currently on metoprolol tartrate 25 mg twice daily, which will need to be increased to 37.5 mg twice daily. EKG showed LVH with repolarization abnormality.  Obtain BNP.  Place referral to structural heart clinic to obtain CT cardiac/TAVR protocol to determine aortic valve calcium scoring.

## 2023-04-12 NOTE — Telephone Encounter (Signed)
 The patient has been notified of the result and verbalized understanding.  All questions (if any) were answered. Roseanne Reno, [facility] 04/12/2023 11:33 AM

## 2023-04-14 ENCOUNTER — Other Ambulatory Visit (HOSPITAL_COMMUNITY)
Admission: RE | Admit: 2023-04-14 | Discharge: 2023-04-14 | Disposition: A | Discharge status: Home / Self Care | Source: Ambulatory Visit | Attending: Internal Medicine | Admitting: Internal Medicine

## 2023-04-14 DIAGNOSIS — R0602 Shortness of breath: Secondary | ICD-10-CM | POA: Diagnosis not present

## 2023-04-14 DIAGNOSIS — I517 Cardiomegaly: Secondary | ICD-10-CM | POA: Insufficient documentation

## 2023-04-14 LAB — BRAIN NATRIURETIC PEPTIDE: B Natriuretic Peptide: 218 pg/mL — ABNORMAL HIGH (ref 0.0–100.0)

## 2023-04-24 ENCOUNTER — Ambulatory Visit: Attending: Cardiovascular Disease | Admitting: Cardiovascular Disease

## 2023-04-24 ENCOUNTER — Encounter: Payer: Self-pay | Admitting: Cardiovascular Disease

## 2023-04-24 VITALS — BP 136/74 | HR 61 | Ht 61.0 in | Wt 129.4 lb

## 2023-04-24 DIAGNOSIS — Z01810 Encounter for preprocedural cardiovascular examination: Secondary | ICD-10-CM | POA: Diagnosis not present

## 2023-04-24 DIAGNOSIS — I350 Nonrheumatic aortic (valve) stenosis: Secondary | ICD-10-CM | POA: Diagnosis not present

## 2023-04-24 DIAGNOSIS — I2510 Atherosclerotic heart disease of native coronary artery without angina pectoris: Secondary | ICD-10-CM

## 2023-04-24 NOTE — Patient Instructions (Signed)
 Lab Work: BMET in 1 week If you have labs (blood work) drawn today and your tests are completely normal, you will receive your results only by: [employer] (if you have MyChart) OR A paper copy in the mail If you have any lab test that is abnormal or we need to change your treatment, we will call you to review the results.  Testing/Procedures: TAVR CT's (you will be called to schedule)  Follow-Up: At [facility], you and your health needs are our priority.  As part of our continuing mission to provide you with exceptional heart care, our providers are all part of one team.  This team includes your primary Cardiologist (physician) and Advanced Practice Providers or APPs (Physician Assistants and Nurse Practitioners) who all work together to provide you with the care you need, when you need it.  Your next appointment:   Structural Team will follow-up  Provider:   Tonny Bollman, MD   1st Floor: - Lobby - Registration  - Pharmacy  - Lab - Cafe  2nd Floor: - PV Lab - Diagnostic Testing (echo, CT, nuclear med)  3rd Floor: - Vacant  4th Floor: - TCTS (cardiothoracic surgery) - AFib Clinic - Structural Heart Clinic - Vascular Surgery  - Vascular Ultrasound  5th Floor: - HeartCare Cardiology (general and EP) - Clinical Pharmacy for coumadin, hypertension, lipid, weight-loss medications, and med management appointments    Valet parking services will be available as well.

## 2023-04-24 NOTE — Progress Notes (Signed)
 Cardiology Office Note:    Date:  04/24/2023   ID:  Kelly Clarke, DOB 1940-12-12, MRN [id number]  PCP:  Donetta Potts, MD   [facility] Providers Cardiologist:  Marjo Bicker, MD     Referring MD: Donetta Potts, MD   Chief Complaint  Patient presents with   Aortic Stenosis    History of Present Illness:    Kelly Clarke is a 83 y.o. female referred for evaluation of aortic stenosis by Dr Jenene Slicker.  The patient is here alone today.  She has a longstanding heart murmur that dates back to when she was a young woman.  She has no history of rheumatic heart disease or family history of valvular heart disease.  The patient does have a history of coronary artery disease and she underwent LAD atherectomy and stenting in 2022.  At that time she was experiencing exertional angina and this has not recurred since then.  She has mild shortness of breath with physical activity such as raking leaves.  She does not report any recent change in this.  She can complete all of her activities of daily living without any problems or symptoms.  She denies chest pain, chest pressure, orthopnea, PND, leg swelling, or heart palpitations.  She denies lightheadedness or syncope.  The patient has a history of hyperdynamic LV function with an intracavitary gradient as well as severe mitral annular calcium.  She recently had an echocardiogram that showed a maximum intracavitary gradient of 62 mmHg with Valsalva.  Her LVEF is estimated at greater than 75% with severe asymmetric LVH of the basal septum.  RV function is normal and PA pressure estimate is normal.  Her left atrium is severely dilated.  There is calcified aortic stenosis noted with moderate to severe calcification and restriction of the aortic valve leaflets, mean transvalvular gradient of 17 mmHg, peak gradient 30 mmHg, stroke-volume index of 40, dimensionless index (LVOT/AV ratio 0.42).  Calculated aortic valve area ranges from 1.32 to 1.4 cm  based on VTI and V-max, respectively.  The patient reports no dental problems.  She lives independently and has 2 children who live locally.  She has been widowed for many years.   Current Medications: Current Meds  Medication Sig   amLODipine (NORVASC) 10 MG tablet Take 10 mg by mouth daily.   Aromatic Inhalants (VICKS VAPOINHALER IN) Inhale 1 puff into the lungs daily as needed (congestion).   aspirin 81 MG chewable tablet Chew 1 tablet (81 mg total) by mouth daily.   losartan (COZAAR) 100 MG tablet Take 100 mg by mouth daily.   metFORMIN (GLUCOPHAGE-XR) 500 MG 24 hr tablet Take 500 mg by mouth daily.   metoprolol tartrate (LOPRESSOR) 25 MG tablet Take 1.5 tablets (37.5 mg total) by mouth 2 (two) times daily.   Multiple Vitamin (MULTIVITAMIN WITH MINERALS) TABS tablet Take 1 tablet by mouth daily.     Allergies:   Patient has no known allergies.   ROS:   Please see the history of present illness.    All other systems reviewed and are negative.  EKGs/Labs/Other Studies Reviewed:    The following studies were reviewed today: Cardiac Studies & Procedures   ______________________________________________________________________________________________ CARDIAC CATHETERIZATION  CARDIAC CATHETERIZATION 12/24/2020  Narrative   Prox LAD lesion is 80% stenosed.   Prox LAD to Mid LAD lesion is 95% stenosed.   Dist LAD lesion is 60% stenosed.   Ost Cx to Prox Cx lesion is 30% stenosed.   Mid Cx lesion  is 30% stenosed.   A drug-eluting stent was successfully placed using a STENT ONYX FRONTIER [id number].   Post intervention, there is a 0% residual stenosis.   Post intervention, there is a 0% residual stenosis.  Severe proximal and mid LAD stenosis, heavily calcified Successful orbital atherectomy with balloon angioplasty and placement of a drug eluting stent in the proximal to mid LAD  Recommendations: Monitor overnight following PCI from groin access with manual pressure. DAPT with ASA  and Plavix at least 6 months. Continue statin and beta blocker. D/c home tomorrow.  Findings Coronary Findings Diagnostic  Dominance: Right  Left Anterior Descending Vessel is large. Prox LAD lesion is 80% stenosed. The lesion is calcified. Prox LAD to Mid LAD lesion is 95% stenosed. The lesion is calcified. Dist LAD lesion is 60% stenosed.  Left Circumflex Vessel is large. Ost Cx to Prox Cx lesion is 30% stenosed. Mid Cx lesion is 30% stenosed.  Intervention  Prox LAD lesion Stent (Also treats lesions: Prox LAD to Mid LAD) CATH VISTA GUIDE 6FR XBLAD3.5 guide catheter was inserted. Lesion crossed with guidewire using a WIRE VIPERWIRE COR FLEX .012. Pre-stent angioplasty was performed using a BALLN SAPPHIRE 2.0X20. A drug-eluting stent was successfully placed using a STENT ONYX FRONTIER [id number]. Stent strut is well apposed. Post-stent angioplasty was performed using a BALLN SAPPHIRE [place] 3.5X12. Post-Intervention Lesion Assessment The intervention was successful. Pre-interventional TIMI flow is 3. Post-intervention TIMI flow is 3. No complications occurred at this lesion. There is a 0% residual stenosis post intervention.  Prox LAD to Mid LAD lesion Stent (Also treats lesions: Prox LAD) See details in Prox LAD lesion. Post-Intervention Lesion Assessment The intervention was successful. Pre-interventional TIMI flow is 3. Post-intervention TIMI flow is 3. No complications occurred at this lesion. There is a 0% residual stenosis post intervention.   CARDIAC CATHETERIZATION  CARDIAC CATHETERIZATION 12/17/2020  Narrative   Prox RCA lesion is 20% stenosed.   Ost Cx to Prox Cx lesion is 30% stenosed.   Mid Cx lesion is 30% stenosed.   Prox LAD lesion is 80% stenosed.   Prox LAD to Mid LAD lesion is 95% stenosed.   Dist LAD lesion is 60% stenosed.  Severe heavily calcified stenosis in the proximal and mid LAD. Mild non-obstructive disease in the Circumflex and RCA Mild aortic  stenosis (mean gradient 8.1 mmHg, Peak to peak gradient 11 mmHg)  Recommendations: She will need staged PCI of the proximal and mid LAD with orbital atherectomy. I will load her with Plavix today and will bring her back in a week or two for the PCI.  Findings Coronary Findings Diagnostic  Dominance: Right  Left Anterior Descending Vessel is large. Prox LAD lesion is 80% stenosed. The lesion is calcified. Prox LAD to Mid LAD lesion is 95% stenosed. The lesion is calcified. Dist LAD lesion is 60% stenosed.  Left Circumflex Vessel is large. Ost Cx to Prox Cx lesion is 30% stenosed. Mid Cx lesion is 30% stenosed.  Right Coronary Artery Prox RCA lesion is 20% stenosed.  Intervention  No interventions have been documented.     ECHOCARDIOGRAM  ECHOCARDIOGRAM COMPLETE 04/11/2023  Narrative ECHOCARDIOGRAM REPORT    Patient Name:   Kelly Clarke Date of Exam: 04/11/2023 Medical Rec #:  [id number]  Height:       61.0 in Accession #:    [id number] Weight:       130.2 lb Date of Birth:  05/02/1940  BSA:  1.574 m Patient Age:    83 years   BP:           138/74 mmHg Patient Gender: F          HR:           79 bpm. [facility]  Procedure: 2D Echo, Cardiac Doppler and Color Doppler (Both Spectral and Color Flow Doppler were utilized during procedure).  Indications:    Aortic Stenosis  History:        Patient has prior history of Echocardiogram examinations, most recent 08/01/2022. CAD; Risk Factors:Hypertension and Diabetes.  Sonographer:    Amy Chionchio Referring Phys: [id number] VISHNU P MALLIPEDDI  IMPRESSIONS   1. Maximum intracavitary gradient at rest and Valsalva is 62 mm Hg. Left ventricular ejection fraction, by estimation, is >75%. The left ventricle has hyperdynamic function. The left ventricle has no regional wall motion abnormalities. There is severe asymmetric left ventricular hypertrophy of the basal and septal segments. Left ventricular  diastolic parameters are consistent with Grade I diastolic dysfunction (impaired relaxation). Elevated left ventricular end-diastolic pressure. 2. Right ventricular systolic function is normal. The right ventricular size is normal. There is normal pulmonary artery systolic pressure. 3. Left atrial size was severely dilated. 4. The mitral valve is abnormal. Mild mitral valve regurgitation. Mild mitral stenosis. Severe mitral annular calcification. 5. The aortic valve is calcified. There is severe calcifcation of the aortic valve. There is severe thickening of the aortic valve. Aortic valve regurgitation is not visualized. Mild to moderate aortic valve stenosis. Aortic valve area, by VTI measures 1.32 cm. Aortic valve mean gradient measures 16.5 mmHg. Aortic valve Vmax measures 2.73 m/s. 6. The inferior vena cava is normal in size with greater than 50% respiratory variability, suggesting right atrial pressure of 3 mmHg.  Comparison(s): No significant change from prior study.  FINDINGS Left Ventricle: Left ventricular ejection fraction, by estimation, is >75%. The left ventricle has hyperdynamic function. The left ventricle has no regional wall motion abnormalities. Strain was performed and the global longitudinal strain is indeterminate. The left ventricular internal cavity size was normal in size. There is severe asymmetric left ventricular hypertrophy of the basal and septal segments. Left ventricular diastolic parameters are consistent with Grade I diastolic dysfunction (impaired relaxation). Elevated left ventricular end-diastolic pressure.  Right Ventricle: The right ventricular size is normal. No increase in right ventricular wall thickness. Right ventricular systolic function is normal. There is normal pulmonary artery systolic pressure. The tricuspid regurgitant velocity is 2.66 m/s, and with an assumed right atrial pressure of 3 mmHg, the estimated right ventricular systolic pressure is 31.3  mmHg.  Left Atrium: Left atrial size was severely dilated.  Right Atrium: Right atrial size was normal in size.  Pericardium: There is no evidence of pericardial effusion.  Mitral Valve: The mitral valve is abnormal. Severe mitral annular calcification. Mild mitral valve regurgitation. Mild mitral valve stenosis. MV peak gradient, 9.4 mmHg. The mean mitral valve gradient is 3.0 mmHg.  Tricuspid Valve: The tricuspid valve is normal in structure. Tricuspid valve regurgitation is trivial. No evidence of tricuspid stenosis.  Aortic Valve: The aortic valve is calcified. There is severe calcifcation of the aortic valve. There is severe thickening of the aortic valve. Aortic valve regurgitation is not visualized. Mild to moderate aortic stenosis is present. Aortic valve mean gradient measures 16.5 mmHg. Aortic valve peak gradient measures 29.8 mmHg. Aortic valve area, by VTI measures 1.32 cm.  Pulmonic Valve: The pulmonic valve was normal in  structure. Pulmonic valve regurgitation is not visualized. No evidence of pulmonic stenosis.  Aorta: The aortic root is normal in size and structure.  Venous: The inferior vena cava is normal in size with greater than 50% respiratory variability, suggesting right atrial pressure of 3 mmHg.  IAS/Shunts: No atrial level shunt detected by color flow Doppler.  Additional Comments: 3D was performed not requiring image post processing on an independent workstation and was indeterminate.   LEFT VENTRICLE PLAX 2D LVIDd:         2.50 cm     Diastology LVIDs:         1.40 cm     LV e' medial:    3.05 cm/s LV PW:         0.60 cm     LV E/e' medial:  25.5 LV IVS:        1.60 cm     LV e' lateral:   5.11 cm/s LVOT diam:     2.00 cm     LV E/e' lateral: 15.2 LV SV:         63 LV SV Index:   40 LVOT Area:     3.14 cm  LV Volumes (MOD) LV vol d, MOD A2C: 52.5 ml LV vol d, MOD A4C: 85.7 ml LV vol s, MOD A2C: 23.6 ml LV vol s, MOD A4C: 27.8 ml LV SV MOD A2C:      28.9 ml LV SV MOD A4C:     85.7 ml LV SV MOD BP:      44.2 ml  RIGHT VENTRICLE          IVC RV Basal diam:  2.80 cm  IVC diam: 1.60 cm TAPSE (M-mode): 1.1 cm  LEFT ATRIUM              Index        RIGHT ATRIUM          Index LA diam:        3.10 cm  1.97 cm/m   RA Area:     9.89 cm LA Vol (A2C):   67.7 ml  43.02 ml/m  RA Volume:   20.60 ml 13.09 ml/m LA Vol (A4C):   138.0 ml 87.69 ml/m LA Biplane Vol: 97.4 ml  61.89 ml/m AORTIC VALVE                     PULMONIC VALVE AV Area (Vmax):    1.40 cm      PV Vmax:       0.68 m/s AV Area (Vmean):   1.32 cm      PV Peak grad:  1.9 mmHg AV Area (VTI):     1.32 cm AV Vmax:           273.00 cm/s AV Vmean:          191.500 cm/s AV VTI:            0.483 m AV Peak Grad:      29.8 mmHg AV Mean Grad:      16.5 mmHg LVOT Vmax:         122.00 cm/s LVOT Vmean:        80.700 cm/s LVOT VTI:          0.202 m LVOT/AV VTI ratio: 0.42  AORTA Ao Root diam: 2.70 cm  MITRAL VALVE                TRICUSPID VALVE MV Area (PHT): 1.93 cm  TR Peak grad:   28.3 mmHg MV Area VTI:   1.71 cm     TR Vmax:        266.00 cm/s MV Peak grad:  9.4 mmHg MV Mean grad:  3.0 mmHg     SHUNTS MV Vmax:       1.53 m/s     Systemic VTI:  0.20 m MV Vmean:      86.6 cm/s    Systemic Diam: 2.00 cm MV Decel Time: 394 msec MV E velocity: 77.80 cm/s MV A velocity: 143.00 cm/s MV E/A ratio:  0.54  Vishnu Priya Mallipeddi Electronically signed by Winfield Rast Mallipeddi Signature Date/Time: 04/11/2023/4:06:37 PM    Final          ______________________________________________________________________________________________      EKG:   EKG Interpretation Date/Time:  Monday April 24 2023 16:01:39 EDT Ventricular Rate:  61 PR Interval:  144 QRS Duration:  104 QT Interval:  432 QTC Calculation: 434 R Axis:   90  Text Interpretation: Normal sinus rhythm Septal infarct , age undetermined Possible Lateral infarct , age undetermined ST & T wave  abnormality, consider inferior ischemia When compared with ECG of 30-Mar-2023 13:16, T wave inversion no longer evident in Anterolateral leads Confirmed by Tonny Bollman [phone]) on 04/24/2023 4:18:00 PM    Recent Labs: 04/14/2023: B Natriuretic Peptide 218.0  Recent Lipid Panel    Component Value Date/Time   CHOL 108 01/25/2021 0905   TRIG 84 01/25/2021 0905   HDL 44 01/25/2021 0905   CHOLHDL 2.5 01/25/2021 0905   VLDL 17 01/25/2021 0905   LDLCALC 47 01/25/2021 0905     Risk Assessment/Calculations:                Physical Exam:    VS:  BP 136/74   Pulse 61   Ht 5\' 1"  (1.549 m)   Wt 129 lb 6.4 oz (58.7 kg)   SpO2 96%   BMI 24.45 kg/m     Wt Readings from Last 3 Encounters:  04/24/23 129 lb 6.4 oz (58.7 kg)  03/30/23 130 lb 3.2 oz (59.1 kg)  08/29/22 135 lb (61.2 kg)     GEN:  Well nourished, well developed in no acute distress HEENT: Normal NECK: No JVD; No carotid bruits LYMPHATICS: No lymphadenopathy CARDIAC: RRR, 3/6 harsh crescendo decrescendo murmur at the right upper sternal border, A2 is present. RESPIRATORY:  Clear to auscultation without rales, wheezing or rhonchi  ABDOMEN: Soft, non-tender, non-distended MUSCULOSKELETAL:  No edema; No deformity  SKIN: Warm and dry NEUROLOGIC:  Alert and oriented x 3 PSYCHIATRIC:  Normal affect   Assessment & Plan Nonrheumatic aortic valve stenosis The patient has NYHA functional class II symptoms of mild exertional dyspnea with activity which could be related to a combination of low-flow low gradient aortic stenosis, LVH with intracavitary LV gradient, and advanced age.  I personally reviewed her echo images today and agree that she has severe leaflet restriction and calcification.  However, her Doppler parameters are more consistent with moderate aortic stenosis.  Certain measurements are affected by her high subvalvular gradient.  I think it is reasonable to proceed with a gated CTA of the heart TAVR protocol and a CTA  of the chest, abdomen, and pelvis to better evaluate her aortic valve calcium score, leaflet mobility, and anatomic suitability for TAVR.  The patient seems somewhat reluctant to proceed with further testing and I did not recommend repeat right and left heart catheterization at this time.  I  think it is more prudent in her case to start with a CTA study and see if further testing is indicated.  I do not think she seems interested in pursuing a moderate aortic stenosis continue to access protocol clinical trial at this time.  She would only want to be treated if she truly has severe aortic stenosis based on our discussion today.  Our multidisciplinary heart valve team will review her case and provide follow-up recommendations once her CTA studies are completed.  As part of her evaluation today, we discussed the natural history of aortic stenosis and the patient understands that she may require intervention either in the near future or certainly within the next few years. Coronary artery disease involving native heart without angina pectoris, unspecified vessel or lesion type Appears clinically stable, treated with aspirin, beta-blocker, and a high intensity statin drug. Pre-procedural cardiovascular examination Check metabolic panel prior to CTA studies.      Medication Adjustments/Labs and Tests Ordered: Current medicines are reviewed at length with the patient today.  Concerns regarding medicines are outlined above.  Orders Placed This Encounter  Procedures   Basic metabolic panel with GFR   EKG [id number]   No orders of the defined types were placed in this encounter.   Patient Instructions  Lab Work: BMET in 1 week If you have labs (blood work) drawn today and your tests are completely normal, you will receive your results only by: MyChart Message (if you have MyChart) OR A paper copy in the mail If you have any lab test that is abnormal or we need to change your treatment, we will call you  to review the results.  Testing/Procedures: TAVR CT's (you will be called to schedule)  Follow-Up: At [facility], you and your health needs are our priority.  As part of our continuing mission to provide you with exceptional heart care, our providers are all part of one team.  This team includes your primary Cardiologist (physician) and Advanced Practice Providers or APPs (Physician Assistants and Nurse Practitioners) who all work together to provide you with the care you need, when you need it.  Your next appointment:   Structural Team will follow-up  Provider:   Tonny Bollman, MD   1st Floor: - Lobby - Registration  - Pharmacy  - Lab - Cafe  2nd Floor: - PV Lab - Diagnostic Testing (echo, CT, nuclear med)  3rd Floor: - Vacant  4th Floor: - TCTS (cardiothoracic surgery) - AFib Clinic - Structural Heart Clinic - Vascular Surgery  - Vascular Ultrasound  5th Floor: - HeartCare Cardiology (general and EP) - Clinical Pharmacy for coumadin, hypertension, lipid, weight-loss medications, and med management appointments    Valet parking services will be available as well.     Signed, Tonny Bollman, MD  04/24/2023 5:15 PM    [facility]

## 2023-04-24 NOTE — Assessment & Plan Note (Signed)
 Appears clinically stable, treated with aspirin, beta-blocker, and a high intensity statin drug.

## 2023-04-24 NOTE — Assessment & Plan Note (Signed)
 The patient has NYHA functional class II symptoms of mild exertional dyspnea with activity which could be related to a combination of low-flow low gradient aortic stenosis, LVH with intracavitary LV gradient, and advanced age.  I personally reviewed her echo images today and agree that she has severe leaflet restriction and calcification.  However, her Doppler parameters are more consistent with moderate aortic stenosis.  Certain measurements are affected by her high subvalvular gradient.  I think it is reasonable to proceed with a gated CTA of the heart TAVR protocol and a CTA of the chest, abdomen, and pelvis to better evaluate her aortic valve calcium score, leaflet mobility, and anatomic suitability for TAVR.  The patient seems somewhat reluctant to proceed with further testing and I did not recommend repeat right and left heart catheterization at this time.  I think it is more prudent in her case to start with a CTA study and see if further testing is indicated.  I do not think she seems interested in pursuing a moderate aortic stenosis continue to access protocol clinical trial at this time.  She would only want to be treated if she truly has severe aortic stenosis based on our discussion today.  Our multidisciplinary heart valve team will review her case and provide follow-up recommendations once her CTA studies are completed.  As part of her evaluation today, we discussed the natural history of aortic stenosis and the patient understands that she may require intervention either in the near future or certainly within the next few years.

## 2023-04-26 ENCOUNTER — Telehealth: Payer: Self-pay

## 2023-04-26 MED ORDER — FUROSEMIDE 20 MG PO TABS: 20.0000 mg | ORAL_TABLET | Freq: Every day | ORAL | 3 refills | Status: AC

## 2023-04-26 NOTE — Telephone Encounter (Signed)
-----   Message from Vishnu P Mallipeddi sent at 04/24/2023  4:46 PM EDT ----- Elevated BNP. Start p.o lasix 20 mg once daily.

## 2023-04-26 NOTE — Telephone Encounter (Signed)
 The patient has been notified of the result and verbalized understanding.  All questions (if any) were answered. Roseanne Reno, [facility] 04/26/2023 8:35 AM

## 2023-05-15 ENCOUNTER — Telehealth: Payer: Self-pay

## 2023-05-15 NOTE — Telephone Encounter (Signed)
 I contacted the patient by phone to discuss scheduling TAVR CT scans. At this time the pt remains hesitant about scheduling further testing and states that she is still thinking about whether she would like to proceed.  I provided the patient with my phone number and asked her to contact me if she would like to arrange TAVR CT scans. Pt agreed with plan.

## 2023-05-16 NOTE — Telephone Encounter (Signed)
 Noted. Thx.

## 2023-05-19 ENCOUNTER — Other Ambulatory Visit: Payer: Self-pay

## 2023-05-19 DIAGNOSIS — I350 Nonrheumatic aortic (valve) stenosis: Secondary | ICD-10-CM

## 2023-05-22 ENCOUNTER — Encounter (INDEPENDENT_AMBULATORY_CARE_PROVIDER_SITE_OTHER): Payer: Self-pay | Admitting: Otolaryngology

## 2023-05-22 ENCOUNTER — Ambulatory Visit (INDEPENDENT_AMBULATORY_CARE_PROVIDER_SITE_OTHER): Admitting: Otolaryngology

## 2023-05-22 VITALS — BP 162/72 | HR 81 | Ht 61.0 in | Wt 129.0 lb

## 2023-05-22 DIAGNOSIS — E042 Nontoxic multinodular goiter: Secondary | ICD-10-CM | POA: Diagnosis not present

## 2023-05-22 DIAGNOSIS — R0982 Postnasal drip: Secondary | ICD-10-CM

## 2023-05-22 DIAGNOSIS — M542 Cervicalgia: Secondary | ICD-10-CM

## 2023-05-22 DIAGNOSIS — R0981 Nasal congestion: Secondary | ICD-10-CM

## 2023-05-22 DIAGNOSIS — R070 Pain in throat: Secondary | ICD-10-CM | POA: Diagnosis not present

## 2023-05-22 DIAGNOSIS — K219 Gastro-esophageal reflux disease without esophagitis: Secondary | ICD-10-CM

## 2023-05-22 DIAGNOSIS — J3089 Other allergic rhinitis: Secondary | ICD-10-CM

## 2023-05-22 MED ORDER — FLUTICASONE PROPIONATE 50 MCG/ACT NA SUSP: 2.0000 | Freq: Every day | NASAL | 6 refills | Status: AC

## 2023-05-22 MED ORDER — LEVOCETIRIZINE DIHYDROCHLORIDE 5 MG PO TABS: 5.0000 mg | ORAL_TABLET | Freq: Every evening | ORAL | 3 refills | Status: AC

## 2023-05-22 NOTE — Progress Notes (Unsigned)
 ENT CONSULT:  Reason for Consult: thyroid  nodules    HPI: Discussed the use of AI scribe software for clinical note transcription with the patient, who gave verbal consent to proceed.  History of Present Illness Kelly Clarke is an 83 year old female who presents with neck discomfort and hx of thyroid  nodules. She was referred by her primary care doctor for evaluation of thyroid  nodules due to hx of thyroid  nodules.  She experiences neck pain with variability in location, sometimes affecting the front and back of her neck. No trouble swallowing, changes in voice, or coughing or choking on food or liquids. No pain when swallowing.  She has a history of thyroid  nodules, with the last ultrasound performed in 2012. She received radioactive iodine  treatment approximately 13 years ago, but is unsure of the specific reason. She is not on thyroid  supplements or medications like Synthroid.  She mentions having an aortic valve problem and is under cardiology care. She is scheduled for a cardiac CT chest.   Records Reviewed:  Cardiology 04/24/2023 Kelly Clarke is a 83 y.o. female referred for evaluation of aortic stenosis by Dr Mallipeddi.  The patient is here alone today.  She has a longstanding heart murmur that dates back to when she was a young woman.  She has no history of rheumatic heart disease or family history of valvular heart disease.  The patient does have a history of coronary artery disease and she underwent LAD atherectomy and stenting in 2022.  At that time she was experiencing exertional angina and this has not recurred since then.  She has mild shortness of breath with physical activity such as raking leaves.  She does not report any recent change in this.  She can complete all of her activities of daily living without any problems or symptoms.  She denies chest pain, chest pressure, orthopnea, PND, leg swelling, or heart palpitations.  She denies lightheadedness or syncope.  The patient has a  history of hyperdynamic LV function with an intracavitary gradient as well as severe mitral annular calcium .  She recently had an echocardiogram that showed a maximum intracavitary gradient of 62 mmHg with Valsalva.  Her LVEF is estimated at greater than 75% with severe asymmetric LVH of the basal septum.  RV function is normal and PA pressure estimate is normal.  Her left atrium is severely dilated.  There is calcified aortic stenosis noted with moderate to severe calcification and restriction of the aortic valve leaflets, mean transvalvular gradient of 17 mmHg, peak gradient 30 mmHg, stroke-volume index of 40, dimensionless index (LVOT/AV ratio 0.42).  Calculated aortic valve area ranges from 1.32 to 1.4 cm based on VTI and V-max, respectively.  The patient reports no dental problems.  She lives independently and has 2 children who live locally.  She has been widowed for many years.     Past Medical History:  Diagnosis Date   Diabetes mellitus without complication (HCC)    History of kidney stones    Hypertension     Past Surgical History:  Procedure Laterality Date   CATARACT EXTRACTION W/PHACO Right 02/26/2018   Procedure: CATARACT EXTRACTION PHACO AND INTRAOCULAR LENS PLACEMENT RIGHT EYE (CDE: 9.94);  Surgeon: Tarri Farm, MD;  Location: [facility];  Service: Ophthalmology;  Laterality: Right;   CATARACT EXTRACTION W/PHACO Left 03/12/2018   Procedure: CATARACT EXTRACTION PHACO AND INTRAOCULAR LENS PLACEMENT (IOC);  Surgeon: Tarri Farm, MD;  Location: [facility];  Service: Ophthalmology;  Laterality: Left;  CDE: 11.25   CORONARY ATHERECTOMY N/A  12/24/2020   Procedure: CORONARY ATHERECTOMY;  Surgeon: Odie Benne, MD;  Location: [facility] INVAS[facility] CV LAB;  Service: Cardiovascular;  Laterality: N/A;   CORONARY STENT INTERVENTION N/A 12/24/2020   Procedure: CORONARY STENT INTERVENTION;  Surgeon: Odie Benne, MD;  Location: [facility] INVASIVE CV LAB;  Service: Cardiovascular;  Laterality:  N/A;   RIGHT/LEFT HEART CATH AND CORONARY ANGIOGRAPHY N/A 12/17/2020   Procedure: RIGHT/LEFT HEART CATH AND CORONARY ANGIOGRAPHY;  Surgeon: Odie Benne, MD;  Location: [facility] INVASIVE CV LAB;  Service: Cardiovascular;  Laterality: N/A;    History reviewed. No pertinent family history.  Social History:  reports that she has never smoked. She has never used smokeless tobacco. She reports that she does not drink alcohol and does not use drugs.  Allergies: No Known Allergies  Medications: I have reviewed the patient's current medications.  The PMH, PSH, Medications, Allergies, and SH were reviewed and updated.  ROS: Constitutional: Negative for fever, weight loss and weight gain. Cardiovascular: Negative for chest pain and dyspnea on exertion. Respiratory: Is not experiencing shortness of breath at rest. Gastrointestinal: Negative for nausea and vomiting. Neurological: Negative for headaches. Psychiatric: The patient is not nervous/anxious  Blood pressure (!) 162/72, pulse 81, height 5\' 1"  (1.549 m), weight 129 lb (58.5 kg), SpO2 94%. Body mass index is 24.37 kg/m.  PHYSICAL EXAM:  Exam: General: Well-developed, well-nourished Respiratory Respiratory effort: Equal inspiration and expiration without stridor Cardiovascular Peripheral Vascular: Warm extremities with equal color/perfusion Eyes: No nystagmus with equal extraocular motion bilaterally Neuro/Psych/Balance: Patient oriented to person, place, and time; Appropriate mood and affect; Gait is intact with no imbalance; Cranial nerves I-XII are intact Head and Face Inspection: Normocephalic and atraumatic without mass or lesion Palpation: Facial skeleton intact without bony stepoffs Salivary Glands: No mass or tenderness Facial Strength: Facial motility symmetric and full bilaterally ENT Pinna: External ear intact and fully developed External canal: Canal is patent with intact skin Tympanic Membrane: Clear and  mobile External Nose: No scar or anatomic deformity Internal Nose: Septum is relatively straight. No polyp, or purulence. Mucosal edema and erythema present.  Bilateral inferior turbinate hypertrophy.  Lips, Teeth, and gums: Mucosa and teeth intact and viable TMJ: No pain to palpation with full mobility Oral cavity/oropharynx: No erythema or exudate, no lesions present Nasopharynx: No mass or lesion with intact mucosa Hypopharynx: Intact mucosa without pooling of secretions Larynx Glottic: Full true vocal cord mobility without lesion or mass Supraglottic: Normal appearing epiglottis and AE folds Interarytenoid Space: Moderate pachydermia&edema Subglottic Space: Patent without lesion or edema Neck Neck and Trachea: Midline trachea without mass or lesion Thyroid : No mass or nodularity Lymphatics: No lymphadenopathy  Procedure: Preoperative diagnosis: anterior neck discomfort hx of thyroid  nodules   Postoperative diagnosis:   Same  Procedure: Flexible fiberoptic laryngoscopy  Surgeon: Artice Last, MD  Anesthesia: Topical lidocaine  and Afrin Complications: None Condition is stable throughout exam  Indications and consent:  The patient presents to the clinic with above symptoms. Indirect laryngoscopy view was incomplete. Thus it was recommended that they undergo a flexible fiberoptic laryngoscopy. All of the risks, benefits, and potential complications were reviewed with the patient preoperatively and verbal informed consent was obtained.  Procedure: The patient was seated upright in the clinic. Topical lidocaine  and Afrin were applied to the nasal cavity. After adequate anesthesia had occurred, I then proceeded to pass the flexible telescope into the nasal cavity. The nasal cavity was patent without rhinorrhea or polyp. The nasopharynx was also patent without mass or lesion.  The base of tongue was visualized and was normal. There were no signs of pooling of secretions in the  piriform sinuses. The true vocal folds were mobile bilaterally. There were no signs of glottic or supraglottic mucosal lesion or mass. There was moderate interarytenoid pachydermia and post cricoid edema. The telescope was then slowly withdrawn and the patient tolerated the procedure throughout.      Studies Reviewed: Thyroid  neck 05/25/2010 IMPRESSION:  Two thyroid  masses are identified, 3.0 x 2.3 x 3.5 cm at mid right  thyroid  lobe and 1.6 x 1.3 x 1.3 cm at inferior left isthmus.  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  Consider radioiodine therapy with I 131 as treatment for  hyperfunctioning thyroid  adenomas.   06/25/2010 IMPRESSION:  Radioactive iodine  therapy for hyperfunctioning thyroid  adenomas  utilizing 25 mCi of I-131 sodium iodide orally.    Assessment/Plan: Encounter Diagnoses  Name Primary?   Multiple thyroid  nodules Yes   Neck discomfort    Cervicalgia    Throat discomfort    Post-nasal drip    Chronic GERD    Environmental and seasonal allergies    Chronic nasal congestion     Assessment and Plan Assessment & Plan Chronic Throat discomfort  Postnasal drainage and reflux changes seen on scope exam today Postnasal drainage and reflux changes identified, potentially causing throat discomfort. No prior treatment with antihistamines or nasal corticosteroids. No history of reflux medication use. Reflux medication not prescribed due to potential side effects in older adults; diet and lifestyle modifications recommended. - Prescribed Flonase nasal spray. - Prescribed Xyzal at bedtime. - Advised diet and lifestyle modifications for reflux management. - Included diet and  lifestyle modification information in after visit summary.  Chronic Neck pain Cervicalgia  Intermittent neck pain reported. We discussed that her neck pain sounds musculoskeletal and I advised the patient to discuss it with her PCP. Referral to spine specialist or physical therapist recommended but I deferred to her primary care doctor to evaluate her for that and make arrangements.  - Advised consultation with primary care physician regarding neck pain. - Suggest referral to spine specialist or physical therapist for management - defer to PCP to make arrangements   Hyperactive thyroid  nodules hx Treated with radioactive iodine  13 years ago. No recent thyroid  ultrasound or laboratory evaluation. No current thyroid  medication. Endocrinology referral required for further evaluation and management. - Ordered thyroid  ultrasound. - Ordered thyroid  function tests TSH. - Refer to endocrinologist for further evaluation and management. - we discussed that if her thyroid  nodules warrant surgical interventions such as thyroid  lobectomy or total thyroidectomy, she will call to make an appt to be seen again   Aortic valve disorder Aortic valve disorder with recent cardiology follow-up. CT scan scheduled.   Thank you for allowing me to participate in the care of this patient. Please do not hesitate to contact me with any questions or concerns.   Artice Last, MD Otolaryngology [facility] Phone: [phone] Fax: [phone]    05/23/2023, 7:10 AM

## 2023-05-22 NOTE — Patient Instructions (Signed)
-   Take Reflux Gourmet (natural supplement available on Amazon) to help with symptoms of chronic throat irritation      GamingLesson.nl - check out this website to learn more about reflux   -Avoid lying down for at least two hours after a meal or after drinking acidic beverages, like soda, or other caffeinated beverages. This can help to prevent stomach contents from flowing back into the esophagus. -Keep your head elevated while you sleep. Using an extra pillow or two can also help to prevent reflux. -Eat smaller and more frequent meals each day instead of a few large meals. This promotes digestion and can aid in preventing heartburn. -Wear loose-fitting clothes to ease pressure on the stomach, which can worsen heartburn and reflux. -Reduce excess weight around the midsection. This can ease pressure on the stomach. Such pressure can force some stomach contents back up the esophagus

## 2023-05-24 ENCOUNTER — Other Ambulatory Visit (HOSPITAL_COMMUNITY)
Admission: RE | Admit: 2023-05-24 | Discharge: 2023-05-24 | Disposition: A | Discharge status: Home / Self Care | Source: Ambulatory Visit | Attending: Cardiovascular Disease | Admitting: Cardiovascular Disease

## 2023-05-24 DIAGNOSIS — Z01812 Encounter for preprocedural laboratory examination: Secondary | ICD-10-CM | POA: Diagnosis not present

## 2023-05-24 DIAGNOSIS — Z01818 Encounter for other preprocedural examination: Secondary | ICD-10-CM | POA: Diagnosis not present

## 2023-05-24 DIAGNOSIS — I350 Nonrheumatic aortic (valve) stenosis: Secondary | ICD-10-CM | POA: Insufficient documentation

## 2023-05-24 LAB — BASIC METABOLIC PANEL WITH GFR
Anion gap: 9 (ref 5–15)
BUN: 16 mg/dL (ref 8–23)
CO2: 28 mmol/L (ref 22–32)
Calcium: 9.7 mg/dL (ref 8.9–10.3)
Chloride: 99 mmol/L (ref 98–111)
Creatinine, Ser: 0.84 mg/dL (ref 0.44–1.00)
GFR, Estimated: >60 mL/min (ref >60)
Glucose, Bld: 112 mg/dL — ABNORMAL HIGH (ref 70–99)
Potassium: 4.1 mmol/L (ref 3.5–5.1)
Sodium: 136 mmol/L (ref 135–145)

## 2023-05-31 DIAGNOSIS — D529 Folate deficiency anemia, unspecified: Secondary | ICD-10-CM | POA: Diagnosis not present

## 2023-05-31 DIAGNOSIS — D519 Vitamin B12 deficiency anemia, unspecified: Secondary | ICD-10-CM | POA: Diagnosis not present

## 2023-05-31 DIAGNOSIS — D649 Anemia, unspecified: Secondary | ICD-10-CM | POA: Diagnosis not present

## 2023-06-05 ENCOUNTER — Ambulatory Visit (HOSPITAL_COMMUNITY)
Admission: RE | Admit: 2023-06-05 | Discharge: 2023-06-05 | Disposition: A | Discharge status: Home / Self Care | Source: Ambulatory Visit | Attending: Otolaryngology | Admitting: Otolaryngology

## 2023-06-05 DIAGNOSIS — E041 Nontoxic single thyroid nodule: Secondary | ICD-10-CM | POA: Diagnosis not present

## 2023-06-05 DIAGNOSIS — E042 Nontoxic multinodular goiter: Secondary | ICD-10-CM | POA: Diagnosis not present

## 2023-06-06 ENCOUNTER — Ambulatory Visit (HOSPITAL_COMMUNITY)
Admission: RE | Admit: 2023-06-06 | Discharge: 2023-06-06 | Disposition: A | Discharge status: Home / Self Care | Source: Ambulatory Visit | Attending: Cardiology | Admitting: Cardiology

## 2023-06-06 DIAGNOSIS — Z01810 Encounter for preprocedural cardiovascular examination: Secondary | ICD-10-CM | POA: Diagnosis not present

## 2023-06-06 DIAGNOSIS — I350 Nonrheumatic aortic (valve) stenosis: Secondary | ICD-10-CM | POA: Insufficient documentation

## 2023-06-06 DIAGNOSIS — E041 Nontoxic single thyroid nodule: Secondary | ICD-10-CM | POA: Diagnosis not present

## 2023-06-06 DIAGNOSIS — I517 Cardiomegaly: Secondary | ICD-10-CM | POA: Diagnosis not present

## 2023-06-06 MED ORDER — IOHEXOL 350 MG/ML SOLN
100.0000 mL | Freq: Once | INTRAVENOUS | Status: AC | PRN
  Administered 2023-06-06: 100 mL via INTRAVENOUS

## 2023-06-07 ENCOUNTER — Other Ambulatory Visit: Payer: Self-pay | Admitting: Internal Medicine

## 2023-06-09 ENCOUNTER — Ambulatory Visit: Payer: Self-pay | Admitting: Cardiovascular Disease

## 2023-06-13 ENCOUNTER — Other Ambulatory Visit: Payer: Self-pay | Admitting: Internal Medicine

## 2023-06-14 ENCOUNTER — Other Ambulatory Visit: Payer: Self-pay | Admitting: Internal Medicine

## 2023-06-16 ENCOUNTER — Other Ambulatory Visit: Payer: Self-pay | Admitting: Internal Medicine

## 2023-06-21 ENCOUNTER — Other Ambulatory Visit: Payer: Self-pay | Admitting: Internal Medicine

## 2023-06-22 MED ORDER — AMLODIPINE BESYLATE 10 MG PO TABS: 10.0000 mg | ORAL_TABLET | Freq: Every day | ORAL | 3 refills | Status: AC

## 2023-07-05 IMAGING — CT CT ANGIO CHEST
2 of 8 series · 19 of 46 positions shown · IV contrast (Omnipaque or Isovue)
Comparison: None Available.

CLINICAL DATA: Cough abnormal chest x-ray

EXAM:
CT ANGIOGRAPHY CHEST WITH CONTRAST
TECHNIQUE: Multidetector CT imaging of the chest was performed using the
standard protocol during bolus administration of intravenous
contrast. Multiplanar CT image reconstructions and MIPs were
obtained to evaluate the vascular anatomy.

[Series 5: pe axial thins · axial · 0.64mm/px · z∈[+1300,+1543]mm · 16 of 341 slices shown]
[im 19/341  lung]
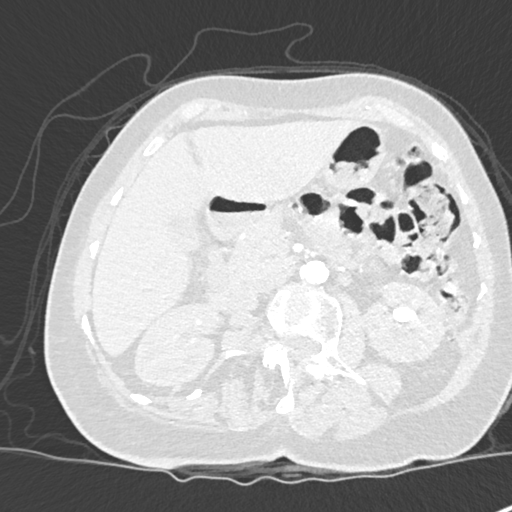
[im 38/341  soft-tissue]
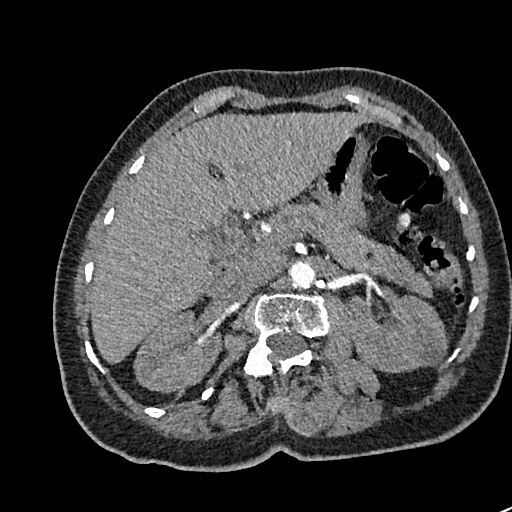
[im 57/341  lung]
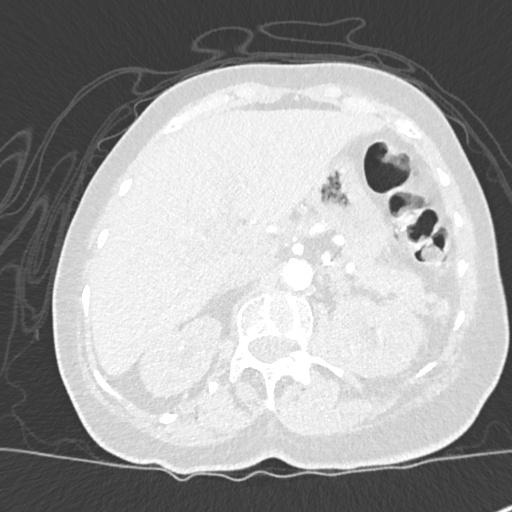
[im 76/341  soft-tissue]
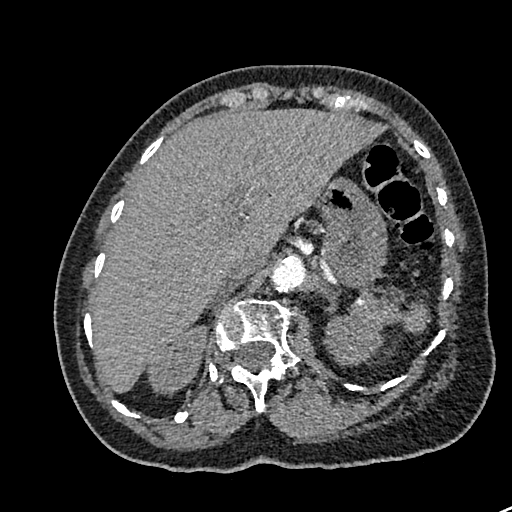
[im 95/341  lung]
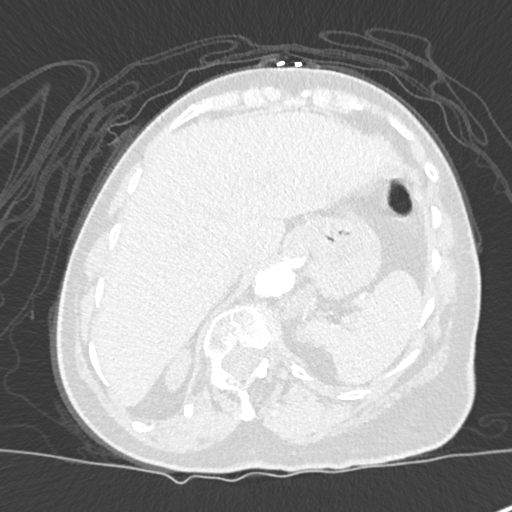
[im 114/341  soft-tissue]
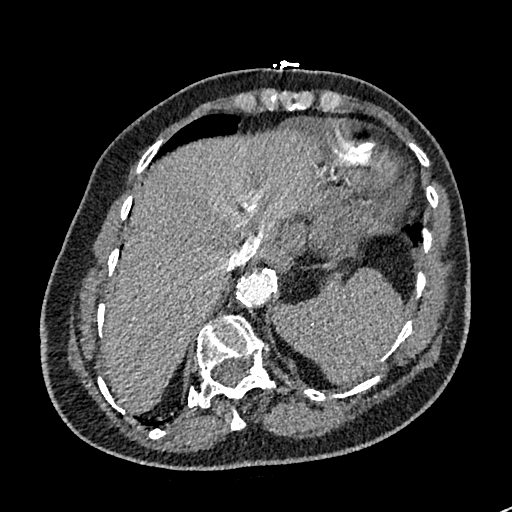
[im 133/341  lung]
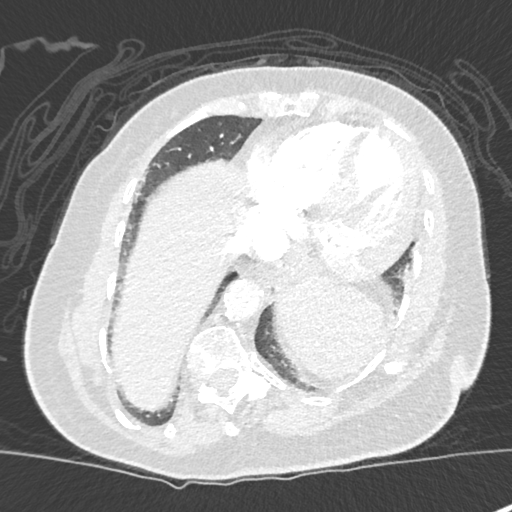
[im 152/341  soft-tissue]
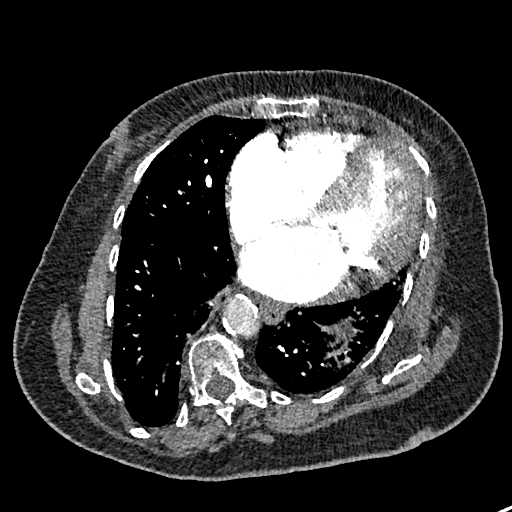
[im 189/341  lung]
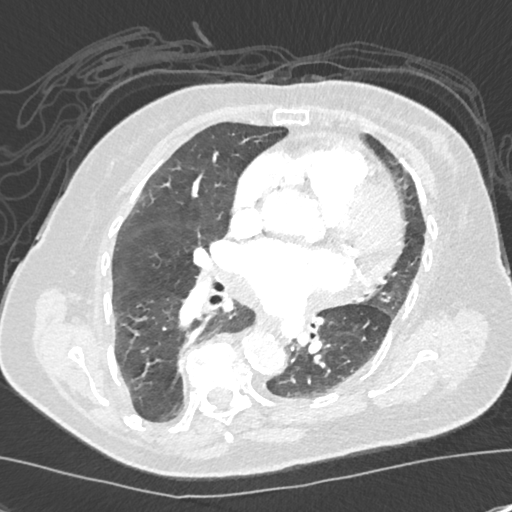
[im 208/341  soft-tissue]
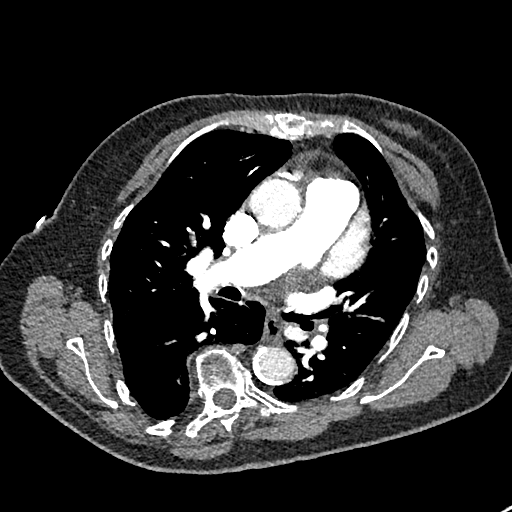
[im 227/341  lung]
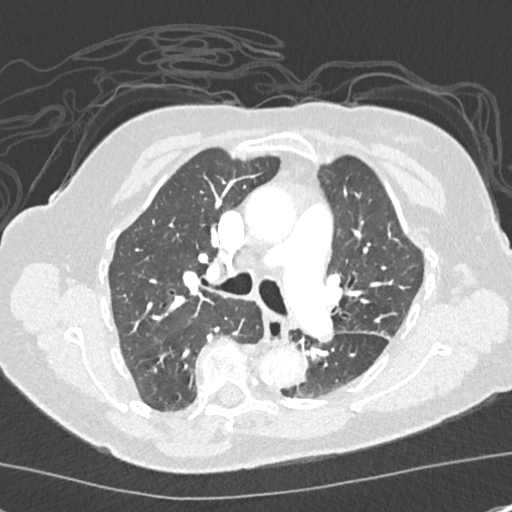
[im 246/341  soft-tissue]
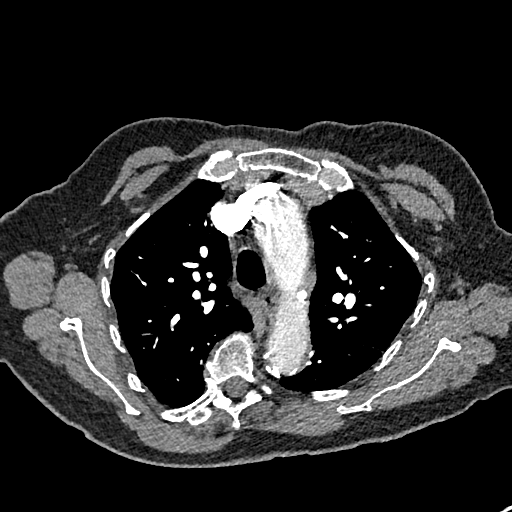
[im 265/341  lung]
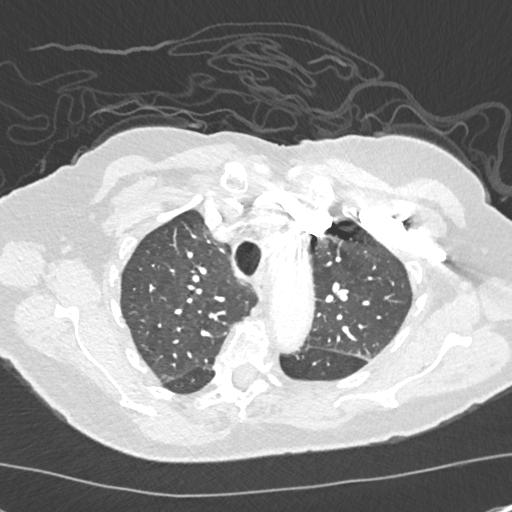
[im 284/341  soft-tissue]
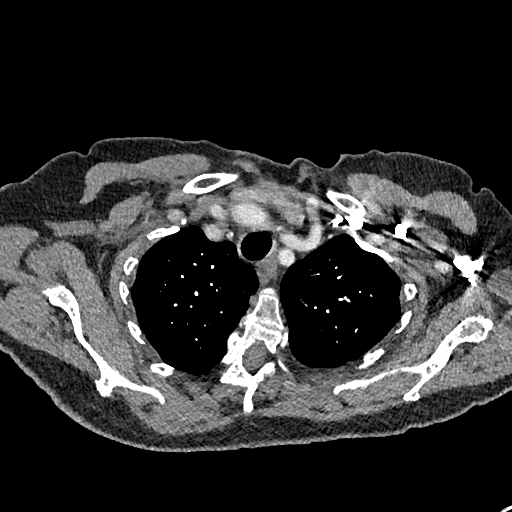
[im 303/341  lung]
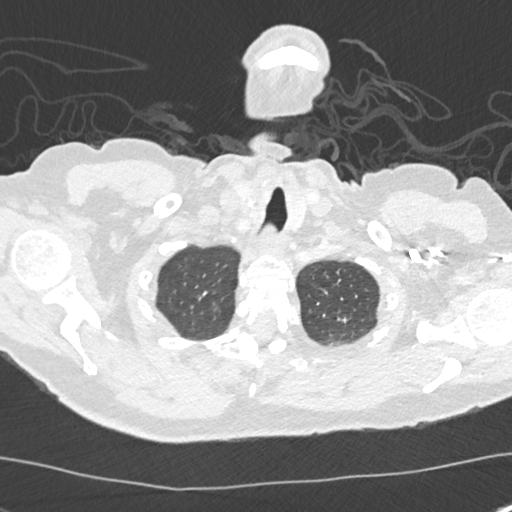
[im 322/341  soft-tissue]
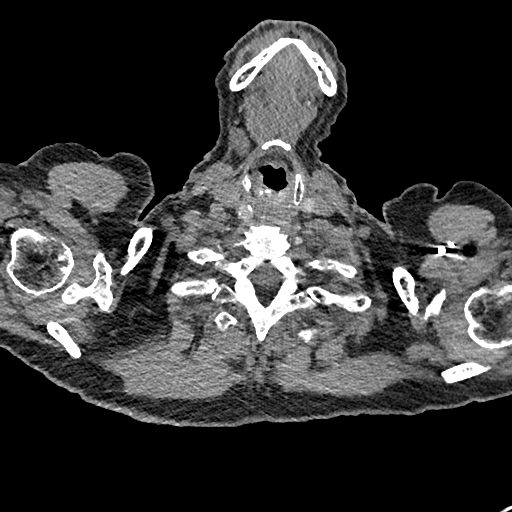

[Series 7: cor soft · coronal · 0.55mm/px · 3 of 150 slices shown]
[im 38/150  soft-tissue]
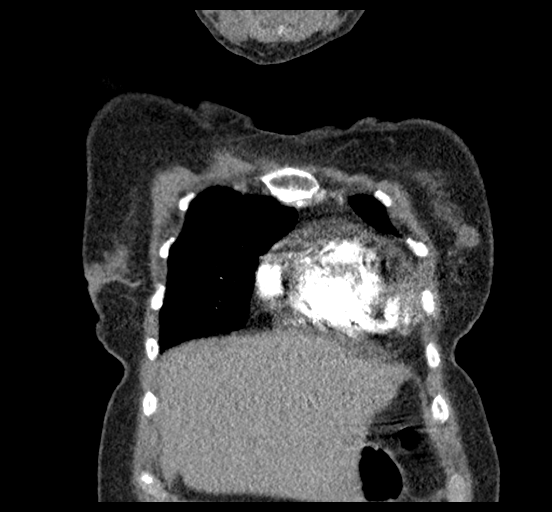
[im 75/150  soft-tissue]
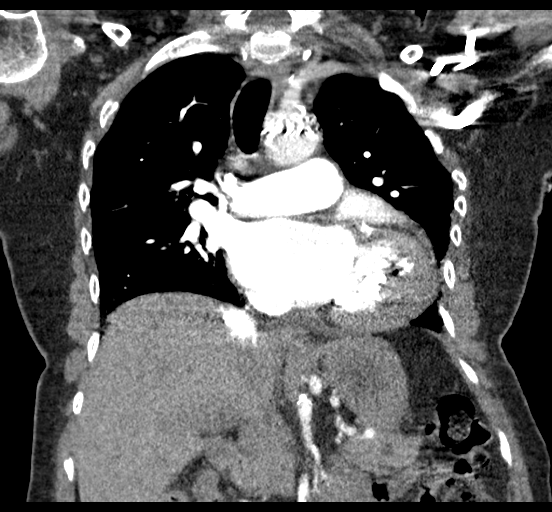
[im 112/150  soft-tissue]
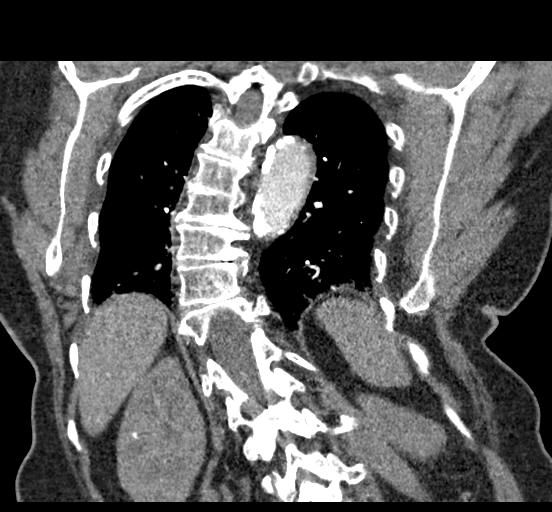

[19 of 46 positions shown; findings below may reference images not displayed]

RADIATION DOSE REDUCTION: This exam was performed according to the
departmental dose-optimization program which includes automated
exposure control, adjustment of the mA and/or kV according to
patient size and/or use of iterative reconstruction technique.

CONTRAST:  100mL OMNIPAQUE IOHEXOL 350 MG/ML SOLN
FINDINGS: Cardiovascular: Satisfactory opacification of the pulmonary arteries
to the segmental level. No evidence of pulmonary embolism. Moderate
aortic atherosclerosis. No aneurysm. Coronary vascular
calcification. Mild cardiomegaly. No pericardial effusion

Mediastinum/Nodes: Midline trachea. No suspicious thyroid nodules.
No suspicious lymph nodes. Esophagus within normal limits

Lungs/Pleura: Lungs are clear. No pleural effusion or pneumothorax.

Upper Abdomen: No acute abnormality. Incompletely visualized large
left kidney stone measuring at least 13 mm

Musculoskeletal: Marked scoliosis of the spine with degenerative
changes. No acute or suspicious osseous abnormality

Review of the MIP images confirms the above findings.
IMPRESSION: 1. Negative for acute pulmonary embolus.
2. Clear lung fields
3. Left kidney stone

Aortic Atherosclerosis ([id number]).

## 2023-11-08 DIAGNOSIS — Z23 Encounter for immunization: Secondary | ICD-10-CM | POA: Diagnosis not present

## 2023-11-28 DIAGNOSIS — E119 Type 2 diabetes mellitus without complications: Secondary | ICD-10-CM | POA: Diagnosis not present

## 2023-12-25 ENCOUNTER — Ambulatory Visit: Admitting: Internal Medicine

## 2024-01-23 NOTE — Progress Notes (Signed)
 " Cardiology Office Note:  .   Date:  01/29/2024  ID:  Kelly Clarke, DOB 15-Oct-1940, MRN [id number] PCP: Trudy Vaughn FALCON, MD  [facility] Providers Cardiologist:  Diannah SHAUNNA Maywood, MD    History of Present Illness: .   Kelly Clarke is a 84 y.o. female with history of CAD s/p LAD PCI in 12/2020 with normal LVEF, valvular heart disease with mod-severe AS, HTN, DM2, HLD.  Per Dr. Wonda: CTA reviewed. She has appropriate anatomy for TAVR with a 23 mm Sapien 3 valve from transfemoral access. Her CTA findings are at the border of moderate-severe AS and her echo findings are also borderline. The patient has mild, stable symptoms. Based on my initial discussion with her, she seemed to favor ongoing surveillance unless very clear indication for AVR. Unless she has changed her mind, she could follow with her primary cardiologist (Dr Maywood) in 6 months and I would see her back in one year with an echo to evaluate for progression. .  Patient comes in for f/u. She denies chest pain, dyspnea, dizziness, palpitations, syncope, edema. She does all her own house work without difficulty, rakes leaves.   ROS:    Studies Reviewed: SABRA         Prior CV Studies:     Narrative & Impression  CLINICAL DATA:  Severe Aortic Stenosis.   EXAM: Cardiac TAVR CT   TECHNIQUE: A non-contrast, gated CT scan was obtained with axial slices of 2.5 mm through the heart for aortic valve scoring. A 120 kV retrospective, gated, contrast cardiac scan was obtained. Gantry rotation speed was 230 msec and collimation was 0.63 mm. Nitroglycerin  was not given. A delayed scan was obtained to exclude left atrial appendage thrombus. The 3D dataset was reconstructed in systole with motion correction. The 3D data set was reconstructed in 5% intervals of the 0-95% of the R-R cycle. Systolic and diastolic phases were analyzed on a dedicated workstation using MPR, MIP, and VRT modes. The patient received 100 cc of  contrast.   FINDINGS: Aortic Valve: Valve Morphology: Tricuspid, calcified valve with reduced excursion the planimeter valve area is 1.09 Sq cm consistent with moderate to severe aortic stenosis   Annular and subannular calcification: There is one single nodule that extends into the LVOT   Aortic Valve Calcium  score: 1296   Presence of basal septal hypertrophy: Severe, septal thickness 18 mm   Perimembranous septal diameter: 8 mm   Mitral Valve: Severe mitral annular calcification beneath the posterior leaflet   Aortic Annulus Measurements-20% phase   Major annulus diameter: 24 mm   Minor annulus diameter: 19 mm   Annular perimeter: 68 mm   Annular area: 3.57 cm2   Aortic Measurements- 75% phase   Sinotubular Junction: 27 mm   Ascending Thoracic Aorta: 32 mm   Aortic Arch: 31 mm   Descending Thoracic Aorta: 23 mm   Aortic atherosclerosis.   Sinus of Valsalva Measurements:   Right coronary cusp width: 30 mm   Left coronary cusp width: 32 mm   Non coronary cusp width: 30 mm   Coronary Artery Height above Annulus:   Left Main: 16 mm   Left SoV height: 19 mm   Right Coronary: 16 mm   Right SoV height: 19 mm   Optimum Fluoroscopic Angle for Delivery: LAO 6, CAU 7   Cusp overlay view angle: RAO 0, CAU 13   Valves for structural team consideration:   23 mm Edwards Sapien Valve   26 mm  Evolut Valve   Non TAVR Valve Findings:   Coronary Arteries: Normal coronary origin. Study not completed with nitroglycerin .   Coronary Calcium  Score deferred in the setting of prior intervention.   Systemic veins: Normal anatomy; coronary sinus dilation 17 mm   Main Pulmonary artery: Mild dilation 28 mm   Pulmonary veins: Normal anatomy   Left atrial appendage: Patent   Interatrial septum: No clear communication.   Chamber dimensions: Right ventricular dilation, bi-atrial dilation   Pericardium: No pericardial effusion.   Extra Cardiac Findings as per  separate reporting.   Notable artifacts: NA   Image quality: Excellent   IMPRESSION: 1. Severe Aortic stenosis. Measurements for potential interventions as above.   Stanly Leavens MD   Electronically Signed: By: Stanly Leavens M.D. On: 06/06/2023 17:01   Echo 03/2023 IMPRESSIONS     1. Maximum intracavitary gradient at rest and Valsalva is 62 mm Hg. Left  ventricular ejection fraction, by estimation, is >75%. The left ventricle  has hyperdynamic function. The left ventricle has no regional wall motion  abnormalities. There is severe  asymmetric left ventricular hypertrophy of the basal and septal segments.  Left ventricular diastolic parameters are consistent with Grade I  diastolic dysfunction (impaired relaxation). Elevated left ventricular  end-diastolic pressure.   2. Right ventricular systolic function is normal. The right ventricular  size is normal. There is normal pulmonary artery systolic pressure.   3. Left atrial size was severely dilated.   4. The mitral valve is abnormal. Mild mitral valve regurgitation. Mild  mitral stenosis. Severe mitral annular calcification.   5. The aortic valve is calcified. There is severe calcifcation of the  aortic valve. There is severe thickening of the aortic valve. Aortic valve  regurgitation is not visualized. Mild to moderate aortic valve stenosis.  Aortic valve area, by VTI measures  1.32 cm. Aortic valve mean gradient measures 16.5 mmHg. Aortic valve Vmax  measures 2.73 m/s.   6. The inferior vena cava is normal in size with greater than 50%  respiratory variability, suggesting right atrial pressure of 3 mmHg.   Comparison(s): No significant change from prior study.    Risk Assessment/Calculations:             Physical Exam:   VS:  BP 124/76 (BP Location: Right Arm, Cuff Size: Normal)   Pulse 74   Ht 5' (1.524 m)   Wt 122 lb 6.4 oz (55.5 kg)   SpO2 97%   BMI 23.90 kg/m    Orhtostatics: No data  found. Wt Readings from Last 3 Encounters:  01/29/24 122 lb 6.4 oz (55.5 kg)  05/22/23 129 lb (58.5 kg)  04/24/23 129 lb 6.4 oz (58.7 kg)    GEN: Thin, in no acute distress NECK: No JVD; No carotid bruits CARDIAC:  RRR, 3-4/6 systolic murmur LSB, rubs, gallops RESPIRATORY:  Clear to auscultation without rales, wheezing or rhonchi  ABDOMEN: Soft, non-tender, non-distended EXTREMITIES:  No edema; No deformity   ASSESSMENT AND PLAN: .    Aortic stenosis mod-severe-has been evaluated by Dr. Wonda for possible TAVR and has preferred medical management due to lack of symptoms. Will order repeat echo for April with f/u in May with Dr. Wonda.  Asymptomatic  CAD DES LAD 12/2020-no angina -continue ASA, Metoprolol , amlodipine , losartan , & crestor  -check labs.  HTN-well controlled on above   HLD-on crestor -needs FLP        Dispo: f/u in May Dr. Wonda, Dr. Stacia 1 yr.  Signed, Olivia Pavy, PA-C   "

## 2024-01-29 ENCOUNTER — Encounter: Payer: Self-pay | Admitting: Physician Assistant

## 2024-01-29 ENCOUNTER — Ambulatory Visit: Attending: Physician Assistant | Admitting: Physician Assistant

## 2024-01-29 VITALS — BP 124/76 | HR 74 | Ht 60.0 in | Wt 122.4 lb

## 2024-01-29 DIAGNOSIS — Z79899 Other long term (current) drug therapy: Secondary | ICD-10-CM | POA: Diagnosis not present

## 2024-01-29 DIAGNOSIS — I350 Nonrheumatic aortic (valve) stenosis: Secondary | ICD-10-CM | POA: Diagnosis not present

## 2024-01-29 DIAGNOSIS — I2510 Atherosclerotic heart disease of native coronary artery without angina pectoris: Secondary | ICD-10-CM | POA: Diagnosis not present

## 2024-01-29 DIAGNOSIS — I10 Essential (primary) hypertension: Secondary | ICD-10-CM

## 2024-01-29 DIAGNOSIS — E785 Hyperlipidemia, unspecified: Secondary | ICD-10-CM

## 2024-01-29 NOTE — Patient Instructions (Signed)
 Medication Instructions:  Your physician recommends that you continue on your current medications as directed. Please refer to the Current Medication list given to you today.  *If you need a refill on your cardiac medications before your next appointment, please call your pharmacy*  Lab Work: Your physician recommends that you return for lab work in: April ( on the day you come for your Echo)   Please have this done at [facility] (hours-Monday through Friday from 8:00 am to 4:00 pm except 11:30 am to 12:10 pm)   If you have labs (blood work) drawn today and your tests are completely normal, you will receive your results only by: MyChart Message (if you have MyChart) OR A paper copy in the mail If you have any lab test that is abnormal or we need to change your treatment, we will call you to review the results.  Testing/Procedures: Your physician has requested that you have an echocardiogram. Echocardiography is a painless test that uses sound waves to create images of your heart. It provides your doctor with information about the size and shape of your heart and how well your hearts chambers and valves are working. This procedure takes approximately one hour. There are no restrictions for this procedure. Please do NOT wear cologne, perfume, aftershave, or lotions (deodorant is allowed). Please arrive 15 minutes prior to your appointment time.  Please note: We ask at that you not bring children with you during ultrasound (echo/ vascular) testing. Due to room size and safety concerns, children are not allowed in the ultrasound rooms during exams. Our front office staff cannot provide observation of children in our lobby area while testing is being conducted. An adult accompanying a patient to their appointment will only be allowed in the ultrasound room at the discretion of the ultrasound technician under special circumstances. We apologize for any inconvenience.   Follow-Up: At [facility], you and your health needs are our priority.  As part of our continuing mission to provide you with exceptional heart care, our providers are all part of one team.  This team includes your primary Cardiologist (physician) and Advanced Practice Providers or APPs (Physician Assistants and Nurse Practitioners) who all work together to provide you with the care you need, when you need it.  Your next appointment:   1 year(s)  Provider:   You may see Vishnu P Mallipeddi, MD or one of the following Advanced Practice Providers on your designated Care Team:   Brittany Strader, PA-C  Scotesia [place]aine, [place] Olivia Pavy, [place]     We recommend signing up for the patient portal called MyChart.  Sign up information is provided on this After Visit Summary.  MyChart is used to connect with patients for Virtual Visits (Telemedicine).  Patients are able to view lab/test results, encounter notes, upcoming appointments, etc.  Non-urgent messages can be sent to your provider as well.   To learn more about what you can do with MyChart, go to [email or web address].   Other Instructions Thank you for choosing [facility]!

## 2024-04-29 ENCOUNTER — Other Ambulatory Visit (HOSPITAL_COMMUNITY)

## 2024-05-06 ENCOUNTER — Ambulatory Visit: Admitting: Cardiovascular Disease
